# Patient Record
Sex: Female | Born: 1935 | Race: White | Hispanic: No | Smoking: Former smoker
Health system: Southern US, Community
[De-identification: ages and names within clinical notes are randomized; demographics above are authoritative.]

## PROBLEM LIST (undated history)

## (undated) DIAGNOSIS — I6350 Cerebral infarction due to unspecified occlusion or stenosis of unspecified cerebral artery: Secondary | ICD-10-CM

## (undated) HISTORY — PX: APPENDECTOMY: SHX54

## (undated) HISTORY — PX: ABDOMINAL HYSTERECTOMY: SHX81

## (undated) HISTORY — DX: Cerebral infarction due to unspecified occlusion or stenosis of unspecified cerebral artery: I63.50

## (undated) HISTORY — PX: CHOLECYSTECTOMY: SHX55

---

## 1998-05-29 DIAGNOSIS — Z90710 Acquired absence of both cervix and uterus: Secondary | ICD-10-CM | POA: Insufficient documentation

## 2000-02-29 ENCOUNTER — Ambulatory Visit (HOSPITAL_COMMUNITY): Admission: RE | Admit: 2000-02-29 | Discharge: 2000-02-29 | Payer: Self-pay | Admitting: Neurosurgery

## 2000-02-29 ENCOUNTER — Encounter: Payer: Self-pay | Admitting: Neurosurgery

## 2000-03-14 ENCOUNTER — Encounter: Payer: Self-pay | Admitting: Neurosurgery

## 2000-03-14 ENCOUNTER — Ambulatory Visit (HOSPITAL_COMMUNITY): Admission: RE | Admit: 2000-03-14 | Discharge: 2000-03-14 | Payer: Self-pay | Admitting: Neurosurgery

## 2000-04-24 ENCOUNTER — Ambulatory Visit (HOSPITAL_COMMUNITY): Admission: RE | Admit: 2000-04-24 | Discharge: 2000-04-24 | Payer: Self-pay | Admitting: Neurosurgery

## 2000-05-03 ENCOUNTER — Ambulatory Visit: Admission: RE | Admit: 2000-05-03 | Discharge: 2000-05-03 | Payer: Self-pay | Admitting: Neurosurgery

## 2000-05-03 ENCOUNTER — Encounter: Payer: Self-pay | Admitting: Neurosurgery

## 2001-02-17 ENCOUNTER — Ambulatory Visit (HOSPITAL_COMMUNITY): Admission: RE | Admit: 2001-02-17 | Discharge: 2001-02-17 | Payer: Self-pay | Admitting: Neurology

## 2001-02-17 ENCOUNTER — Encounter: Payer: Self-pay | Admitting: Neurology

## 2004-04-29 ENCOUNTER — Ambulatory Visit: Payer: Self-pay | Admitting: Specialist

## 2004-05-10 ENCOUNTER — Inpatient Hospital Stay: Payer: Self-pay | Admitting: Specialist

## 2004-06-24 ENCOUNTER — Ambulatory Visit: Payer: Self-pay

## 2004-07-01 ENCOUNTER — Ambulatory Visit: Payer: Self-pay

## 2004-08-12 ENCOUNTER — Ambulatory Visit: Payer: Self-pay | Admitting: Specialist

## 2004-08-15 ENCOUNTER — Observation Stay: Payer: Self-pay | Admitting: Specialist

## 2004-08-24 ENCOUNTER — Inpatient Hospital Stay: Payer: Self-pay | Admitting: Unknown Physician Specialty

## 2004-08-24 ENCOUNTER — Observation Stay: Payer: Self-pay | Admitting: Specialist

## 2005-05-29 DIAGNOSIS — I2510 Atherosclerotic heart disease of native coronary artery without angina pectoris: Secondary | ICD-10-CM | POA: Insufficient documentation

## 2005-05-29 HISTORY — PX: CORONARY ARTERY BYPASS GRAFT: SHX141

## 2005-08-14 ENCOUNTER — Emergency Department: Payer: Self-pay | Admitting: Emergency Medicine

## 2005-08-14 ENCOUNTER — Other Ambulatory Visit: Payer: Self-pay

## 2005-08-15 ENCOUNTER — Other Ambulatory Visit: Payer: Self-pay

## 2005-08-25 ENCOUNTER — Ambulatory Visit: Payer: Self-pay | Admitting: Cardiology

## 2005-09-04 ENCOUNTER — Ambulatory Visit: Payer: Self-pay

## 2005-09-05 ENCOUNTER — Ambulatory Visit: Payer: Self-pay | Admitting: *Deleted

## 2005-09-06 ENCOUNTER — Inpatient Hospital Stay (HOSPITAL_COMMUNITY): Admission: EM | Admit: 2005-09-06 | Discharge: 2005-09-08 | Payer: Self-pay | Admitting: Emergency Medicine

## 2005-09-07 ENCOUNTER — Encounter: Payer: Self-pay | Admitting: Vascular Surgery

## 2005-09-19 ENCOUNTER — Ambulatory Visit: Payer: Self-pay | Admitting: Cardiovascular Disease

## 2005-09-23 ENCOUNTER — Inpatient Hospital Stay: Payer: Self-pay | Admitting: Unknown Physician Specialty

## 2005-09-23 ENCOUNTER — Other Ambulatory Visit: Payer: Self-pay

## 2005-10-17 ENCOUNTER — Ambulatory Visit: Payer: Self-pay | Admitting: *Deleted

## 2005-11-27 ENCOUNTER — Encounter (INDEPENDENT_AMBULATORY_CARE_PROVIDER_SITE_OTHER): Payer: Self-pay | Admitting: Specialist

## 2005-11-27 ENCOUNTER — Inpatient Hospital Stay (HOSPITAL_COMMUNITY): Admission: RE | Admit: 2005-11-27 | Discharge: 2005-12-02 | Payer: Self-pay | Admitting: Surgery

## 2005-12-13 ENCOUNTER — Ambulatory Visit: Payer: Self-pay | Admitting: Cardiovascular Disease

## 2006-01-01 ENCOUNTER — Ambulatory Visit: Payer: Self-pay

## 2006-02-06 ENCOUNTER — Ambulatory Visit: Payer: Self-pay | Admitting: Cardiology

## 2006-08-23 ENCOUNTER — Ambulatory Visit: Payer: Self-pay | Admitting: Family Medicine

## 2006-10-03 ENCOUNTER — Ambulatory Visit: Payer: Self-pay | Admitting: Gastroenterology

## 2006-10-03 ENCOUNTER — Encounter: Payer: Self-pay | Admitting: Family Medicine

## 2006-10-09 ENCOUNTER — Encounter: Payer: Self-pay | Admitting: Family Medicine

## 2006-10-19 ENCOUNTER — Encounter: Payer: Self-pay | Admitting: Family Medicine

## 2006-10-24 ENCOUNTER — Ambulatory Visit: Payer: Self-pay | Admitting: Family Medicine

## 2006-10-24 LAB — CONVERTED CEMR LAB
Bilirubin Urine: NEGATIVE
Ketones, urine, test strip: NEGATIVE
Nitrite: NEGATIVE
Specific Gravity, Urine: 1.025
Urobilinogen, UA: 0.2

## 2006-10-25 ENCOUNTER — Encounter: Payer: Self-pay | Admitting: Family Medicine

## 2006-11-04 ENCOUNTER — Emergency Department: Payer: Self-pay | Admitting: Emergency Medicine

## 2006-11-07 ENCOUNTER — Ambulatory Visit: Payer: Self-pay | Admitting: Family Medicine

## 2006-11-07 ENCOUNTER — Telehealth (INDEPENDENT_AMBULATORY_CARE_PROVIDER_SITE_OTHER): Payer: Self-pay | Admitting: *Deleted

## 2006-11-07 LAB — CONVERTED CEMR LAB
Glucose, Urine, Semiquant: NEGATIVE
Ketones, urine, test strip: NEGATIVE
Nitrite: NEGATIVE
pH: 6

## 2006-11-29 ENCOUNTER — Telehealth: Payer: Self-pay | Admitting: Family Medicine

## 2006-12-08 ENCOUNTER — Emergency Department: Payer: Self-pay | Admitting: Unknown Physician Specialty

## 2006-12-08 ENCOUNTER — Other Ambulatory Visit: Payer: Self-pay

## 2006-12-14 ENCOUNTER — Ambulatory Visit: Payer: Self-pay | Admitting: Cardiology

## 2007-08-12 ENCOUNTER — Telehealth: Payer: Self-pay | Admitting: Family Medicine

## 2007-08-12 DIAGNOSIS — N63 Unspecified lump in unspecified breast: Secondary | ICD-10-CM

## 2007-08-15 ENCOUNTER — Encounter: Payer: Self-pay | Admitting: Family Medicine

## 2007-08-20 ENCOUNTER — Encounter (INDEPENDENT_AMBULATORY_CARE_PROVIDER_SITE_OTHER): Payer: Self-pay | Admitting: *Deleted

## 2007-08-26 ENCOUNTER — Encounter: Payer: Self-pay | Admitting: Family Medicine

## 2007-08-26 DIAGNOSIS — IMO0002 Reserved for concepts with insufficient information to code with codable children: Secondary | ICD-10-CM

## 2007-08-26 DIAGNOSIS — I6350 Cerebral infarction due to unspecified occlusion or stenosis of unspecified cerebral artery: Secondary | ICD-10-CM | POA: Insufficient documentation

## 2007-08-26 DIAGNOSIS — R569 Unspecified convulsions: Secondary | ICD-10-CM

## 2007-08-26 DIAGNOSIS — F329 Major depressive disorder, single episode, unspecified: Secondary | ICD-10-CM

## 2007-08-26 DIAGNOSIS — R1310 Dysphagia, unspecified: Secondary | ICD-10-CM

## 2007-08-26 DIAGNOSIS — K7689 Other specified diseases of liver: Secondary | ICD-10-CM

## 2007-08-26 DIAGNOSIS — I2510 Atherosclerotic heart disease of native coronary artery without angina pectoris: Secondary | ICD-10-CM | POA: Insufficient documentation

## 2007-08-26 DIAGNOSIS — K5730 Diverticulosis of large intestine without perforation or abscess without bleeding: Secondary | ICD-10-CM | POA: Insufficient documentation

## 2007-08-26 DIAGNOSIS — Z860100 Personal history of colon polyps, unspecified: Secondary | ICD-10-CM | POA: Insufficient documentation

## 2007-08-26 DIAGNOSIS — R259 Unspecified abnormal involuntary movements: Secondary | ICD-10-CM | POA: Insufficient documentation

## 2007-08-26 DIAGNOSIS — R5382 Chronic fatigue, unspecified: Secondary | ICD-10-CM

## 2007-08-26 DIAGNOSIS — M545 Low back pain: Secondary | ICD-10-CM

## 2007-08-26 DIAGNOSIS — IMO0001 Reserved for inherently not codable concepts without codable children: Secondary | ICD-10-CM

## 2007-08-26 DIAGNOSIS — K449 Diaphragmatic hernia without obstruction or gangrene: Secondary | ICD-10-CM | POA: Insufficient documentation

## 2007-08-26 DIAGNOSIS — R519 Headache, unspecified: Secondary | ICD-10-CM | POA: Insufficient documentation

## 2007-08-26 DIAGNOSIS — K222 Esophageal obstruction: Secondary | ICD-10-CM

## 2007-08-26 DIAGNOSIS — E785 Hyperlipidemia, unspecified: Secondary | ICD-10-CM

## 2007-08-26 DIAGNOSIS — I6529 Occlusion and stenosis of unspecified carotid artery: Secondary | ICD-10-CM

## 2007-08-26 DIAGNOSIS — R51 Headache: Secondary | ICD-10-CM | POA: Insufficient documentation

## 2007-08-26 DIAGNOSIS — Z8679 Personal history of other diseases of the circulatory system: Secondary | ICD-10-CM | POA: Insufficient documentation

## 2007-08-26 DIAGNOSIS — K589 Irritable bowel syndrome without diarrhea: Secondary | ICD-10-CM

## 2007-08-26 DIAGNOSIS — T7492XA Unspecified child maltreatment, confirmed, initial encounter: Secondary | ICD-10-CM | POA: Insufficient documentation

## 2007-08-26 HISTORY — DX: Cerebral infarction due to unspecified occlusion or stenosis of unspecified cerebral artery: I63.50

## 2007-08-27 ENCOUNTER — Ambulatory Visit: Payer: Self-pay | Admitting: Family Medicine

## 2007-08-27 DIAGNOSIS — D239 Other benign neoplasm of skin, unspecified: Secondary | ICD-10-CM | POA: Insufficient documentation

## 2007-08-27 DIAGNOSIS — R109 Unspecified abdominal pain: Secondary | ICD-10-CM

## 2007-08-27 DIAGNOSIS — M159 Polyosteoarthritis, unspecified: Secondary | ICD-10-CM

## 2007-08-27 LAB — CONVERTED CEMR LAB
Bilirubin Urine: NEGATIVE
Ketones, urine, test strip: NEGATIVE
Specific Gravity, Urine: >=1.03

## 2007-09-03 ENCOUNTER — Telehealth: Payer: Self-pay | Admitting: Family Medicine

## 2007-10-10 ENCOUNTER — Ambulatory Visit: Payer: Self-pay | Admitting: Family Medicine

## 2007-10-10 DIAGNOSIS — R3129 Other microscopic hematuria: Secondary | ICD-10-CM

## 2007-10-10 DIAGNOSIS — R197 Diarrhea, unspecified: Secondary | ICD-10-CM

## 2007-10-10 LAB — CONVERTED CEMR LAB
Nitrite: NEGATIVE
Specific Gravity, Urine: >=1.03
WBC Urine, dipstick: NEGATIVE

## 2007-10-14 ENCOUNTER — Encounter: Payer: Self-pay | Admitting: Family Medicine

## 2007-10-18 ENCOUNTER — Encounter (INDEPENDENT_AMBULATORY_CARE_PROVIDER_SITE_OTHER): Payer: Self-pay | Admitting: *Deleted

## 2007-10-20 DIAGNOSIS — M899 Disorder of bone, unspecified: Secondary | ICD-10-CM | POA: Insufficient documentation

## 2007-10-20 DIAGNOSIS — M949 Disorder of cartilage, unspecified: Secondary | ICD-10-CM

## 2007-10-30 ENCOUNTER — Ambulatory Visit: Payer: Self-pay | Admitting: Family Medicine

## 2007-11-08 ENCOUNTER — Ambulatory Visit: Payer: Self-pay | Admitting: Family Medicine

## 2007-11-08 DIAGNOSIS — R5383 Other fatigue: Secondary | ICD-10-CM

## 2007-11-08 DIAGNOSIS — R5381 Other malaise: Secondary | ICD-10-CM

## 2007-11-08 DIAGNOSIS — G4700 Insomnia, unspecified: Secondary | ICD-10-CM | POA: Insufficient documentation

## 2007-11-20 LAB — CONVERTED CEMR LAB
ALT: 18 units/L (ref 0–35)
AST: 29 units/L (ref 0–37)
BUN: 14 mg/dL (ref 6–23)
CO2: 25 meq/L (ref 19–32)
Chloride: 108 meq/L (ref 96–112)
Cholesterol: 232 mg/dL (ref 0–200)
Creatinine, Ser: 0.8 mg/dL (ref 0.4–1.2)
Direct LDL: 175.5 mg/dL
Glucose, Bld: 101 mg/dL — ABNORMAL HIGH (ref 70–99)
Total Bilirubin: 0.7 mg/dL (ref 0.3–1.2)
Total CHOL/HDL Ratio: 5.5

## 2008-02-12 ENCOUNTER — Telehealth: Payer: Self-pay | Admitting: Family Medicine

## 2008-02-20 ENCOUNTER — Encounter (INDEPENDENT_AMBULATORY_CARE_PROVIDER_SITE_OTHER): Payer: Self-pay | Admitting: *Deleted

## 2008-03-06 ENCOUNTER — Ambulatory Visit: Payer: Self-pay | Admitting: Family Medicine

## 2008-03-06 DIAGNOSIS — L299 Pruritus, unspecified: Secondary | ICD-10-CM | POA: Insufficient documentation

## 2008-03-09 ENCOUNTER — Telehealth: Payer: Self-pay | Admitting: Family Medicine

## 2008-03-30 ENCOUNTER — Telehealth: Payer: Self-pay | Admitting: Family Medicine

## 2008-07-06 ENCOUNTER — Ambulatory Visit: Payer: Self-pay | Admitting: Gastroenterology

## 2008-09-30 ENCOUNTER — Ambulatory Visit: Payer: Self-pay | Admitting: Family Medicine

## 2008-11-02 ENCOUNTER — Ambulatory Visit: Payer: Self-pay | Admitting: Family Medicine

## 2008-12-02 DIAGNOSIS — F5105 Insomnia due to other mental disorder: Secondary | ICD-10-CM | POA: Insufficient documentation

## 2009-03-10 ENCOUNTER — Encounter (INDEPENDENT_AMBULATORY_CARE_PROVIDER_SITE_OTHER): Payer: Self-pay | Admitting: *Deleted

## 2010-06-19 ENCOUNTER — Encounter: Payer: Self-pay | Admitting: Neurology

## 2010-06-23 ENCOUNTER — Ambulatory Visit: Payer: Self-pay | Admitting: Ophthalmology

## 2010-06-28 ENCOUNTER — Ambulatory Visit: Payer: Self-pay | Admitting: Ophthalmology

## 2010-08-08 ENCOUNTER — Ambulatory Visit: Payer: Self-pay | Admitting: Ophthalmology

## 2010-10-11 NOTE — Assessment & Plan Note (Signed)
[facility]                            CARDIOLOGY OFFICE NOTE   NAME:[name]                      MRN:          [id number]  DATE:12/14/2006                            DOB:          21-Sep-1933    Ms. [name] comes in today after experiencing what sounds like presyncope  at home about a week ago.  She was evaluated at [facility].  Her records have been sent.  Her EKG showed no acute  changes.  Her cardiac enzymes were non-diagnostic.  Chest x-ray showed  no acute cardiopulmonary disease.  Head CT demonstrated some  microvascular disease but no acute abnormality.  D-dimer was negative.  CBC, comprehensive metabolic panel were all normal.  She has some chest  wall pain but has not had any real ischemic pain.  I am not sure from  talking to her that she really fainted.   CURRENT MEDICATIONS:  1. Aspirin 81 mg  a day.  2. Garlic.  3. Multivitamin.  4. Tranxene 7.5 q.h.s.  5. She is apparently holding her Lipitor for unknown reasons.   As usual she really is very tangential in her answers and very difficult  to pin down.  At one point, she said that they did everything at  [facility] including drew blood out of her breast.   PHYSICAL EXAMINATION:  VITAL SIGNS:  Her blood pressure today is 128/85.  Her pulse is 96 and regular.  Weight is 161.  GENERAL:  She is in no acute distress.  HEENT:  Normocephalic atraumatic.  PERRLA.  Extraocular movements  intact.  Sclerae are clear.  Facial symmetry is normal.  NECK:  Carotids  upstrokes were equal bilaterally without bruits.  There is no JVD.  Thyroid is not enlarged.  Trachea is midline.  LUNGS:  Clear.  HEART:  Reveals a regular rate and rhythm without gallop.  ABDOMEN:  Soft.  Good bowel sounds.  No midline bruit.  There is no  hepatomegaly.  EXTREMITIES:  No cyanosis, clubbing, or edema.  Pulses are intact.  NEUROLOGIC:  Grossly intact.  CHEST:  Pressure on her  chest does reproduce her symptoms in her chest  pain.  I suspect she has some mild costochondritis.  I repeated her EKG  today which showed no acute changes.   ASSESSMENT/PLAN:  1. Presyncope, doubt cardiovascular etiology.  I think there is some      significant psychosomatic component to all of her illness and      presentations.  This has been noted in the past as well.  Please      refer to previous notes.  2. Coronary artery disease, currently stable.  3. Hyperlipidemia, noncompliant with Lipitor.  4. Chest wall pain, probably costochondritis.   I have reassured her and her son that there is nothing acute going on  with her cardiovascular system.  I have set her up for followup in 12  months.     Thomas C. Daleen Squibb, MD, [facility]  Electronically Signed    TCW/MedQ  DD: 12/14/2006  DT: 12/14/2006  Job #: [id number]  cc:   Kerby Nora, MD

## 2010-10-14 NOTE — Assessment & Plan Note (Signed)
[facility]                              CARDIOLOGY OFFICE NOTE   NAME:Stallworth, RAEANN OFFNER                      MRN:          [id number]  DATE:12/13/2005                            DOB:          01-11-1934    SUBJECTIVE:  Ms. Schnieders is a very pleasant 77 year old female patient  followed by Dr. Daleen Squibb with a history of three vessel coronary disease who was  recently admitted to [facility] for coronary artery bypass  grafting.  She had initially been seen back in April when she presented with  substernal chest tightness and underwent catheterization that showed 60%  left main stenosis, 50-60% stenosis of the LAD, and 50-60% stenosis of the  RCA.  She underwent CABG x4 on July 2 by Dr. Laneta Simmers.  She had a LIMA placed  to the LAD, vein graft to the diagonal, vein graft to the obtuse marginal,  vein graft to the posterior descending.  She had also recently been noted to  have right upper and lower lung nodules and these were resected at the time  of her bypass.  The pathology was benign.  Her postoperative course was  fairly uneventful.  She returns today for follow-up.  She is doing well  without any significant shortness of breath, orthopnea, or paroxysmal  nocturnal dyspnea, fevers, chills, syncope, or presyncope.  She does have a  lot of chest soreness as well as right upper chest soreness.  Her previous  anginal symptoms have completely resolved.   CURRENT MEDICATIONS:  1.  Lipitor 10 mg daily.  2.  Tranxene 10.5 mg q.h.s.  3.  Tegretol 200 mg b.i.d.  4.  Aspirin 325 mg daily.  5.  Lopressor 25 mg b.i.d.  6.  Geodon 40 mg in the morning, 80 mg in the evening.  7.  Nitroglycerin p.r.n.  8.  Tylox p.r.n.   PHYSICAL EXAMINATION:  GENERAL:  She is a well-nourished, well-developed  female in no acute distress.  VITAL SIGNS:  Blood pressure is 102/58, pulse 88, weight 145 pounds.  HEENT:  Unremarkable.  NECK:  Without JVD.  CARDIAC:  S1, S2.   Regular rate and rhythm without murmurs.  LUNGS:  Clear to auscultation bilaterally without wheezing, rhonchi, or  rales.  ABDOMEN:  Soft, nontender with normoactive bowel sounds.  No organomegaly.  EXTREMITIES:  Without edema.  SKIN:  Chest and leg wounds are healing well without erythema or discharge.   Electrocardiogram reveals sinus rhythm with a heart rate of 88.  T-wave  inversions in V2-6.   IMPRESSION:  1.  Coronary artery disease.      1.  Status post coronary artery bypass graft x4 with grafts as noted          above.  2.  Preserved left ventricular function.  3.  Benign right upper and lower lung nodules.      1.  Status post wedge resection at time of her bypass.  4.  Bipolar disorder.  5.  Fibromyalgia.  6.  History of cerebrovascular accident in the past.  7.  Treated dyslipidemia.  8.  History of seizure disorder.  9.  Chronic fatigue syndrome.   PLAN:  Patient is doing well from a post bypass standpoint.  Her chest x-ray  today shows a tiny right effusion, otherwise nothing acute is found.  The  final report is pending on her chest x-ray.  This will be provided to her to  take to Dr. Laneta Simmers for her appointment next week.  She is on a good medical  regimen at this point in time with aspirin, statin, and beta blocker.  Will  continue all of her medications same at this point.  She will need follow-up  lipids and LFTs and this will be set up some time in the next couple of  weeks.  I will set her up to see Dr. Daleen Squibb in the next eight weeks.  I have  encouraged the patient to enroll in cardiac rehabilitation at [facility].                                  Tereso Newcomer, PA-C    SW/MedQ  DD:  12/13/2005  DT:  12/13/2005  Job #:  [id number]   cc:   Evelene Croon, MD  [facility]

## 2010-10-14 NOTE — Discharge Summary (Signed)
[name], [name]               ACCOUNT NO.:  [id number]   MEDICAL RECORD NO.:  [id number]          PATIENT TYPE:  INP   LOCATION:  6527                         FACILITY:  [facility]   PHYSICIAN:  Thomas C. Wall, M.D.   DATE OF BIRTH:  03/15/1934   DATE OF ADMISSION:  09/05/2005  DATE OF DISCHARGE:  09/08/2005                                 DISCHARGE SUMMARY   DISCHARGE SUMMARY   PRIMARY CARDIOLOGIST:  Dr. Valera Castle.   NEUROLOGIST:  Dr. Marcelino Freestone.   SURGERY CONSULT:  Dr. Sheliah Plane.   PRINCIPAL DIAGNOSIS:  Chest pain/coronary artery disease.   OTHER DIAGNOSES:  1.  History of fibromyalgia and chronic fatigue.  2.  Questionable history of seizure disorder.  3.  Tobacco abuse.   ALLERGIES:  MRI GADOLINIUM   PROCEDURES:  Left heart cardiac catheterization.   HISTORY OF PRESENT ILLNESS:  This is a 77 year old white female with a prior  history of fibromyalgia and chronic fatigue syndrome, who was admitted on  September 05, 2005 following a 3 to 4 week history of atypical chest pain with  radiation to the right shoulder with subsequent adenosine Myoview showing an  EF of 77% with anterior ischemia or decreased activity in the anterior wall  with reversibility.  She also complained of nausea, vomiting and diarrhea in  the absence of fever with diffuse abdominal cramping and pain.   HOSPITAL COURSE:  The patient ruled out for myocardial infarction by cardiac  markers.  ECG was without acute changes.  She underwent left heart cardiac  catheterization on April 12 which revealed a 6% lesion in the left main with  dampening of pressure wave forms.  The left circumflex was completely  occluded and the right coronary artery had a 60 to 70% mid-stenosis.  EF was  normal.  Films were reviewed with Dr. Daleen Squibb and Dr. Riley Kill, and the decision  was made to pursue a cardiovascular thoracic surgery consult.  The patient  felt that she needed some time to make a decision regarding  bypass surgery  and Dr. Tyrone Sage also recommended outpatient psychiatry and neurology  evaluations prior to pursuing surgery.  Further, she had a chest x-ray  during her hospitalization which revealed bilateral nodular densities and it  was recommended that she undergo a CT scan.  This will be performed as an  outpatient at [facility].  She is being discharged home today  in satisfactory condition with the understanding that she will undergo  psychiatry and neurology evaluation as well as a CAT scan performed prior to  seeing Dr. Daleen Squibb back in the clinic on April 14 at 11:15 a.m.   DISCHARGE LABS:  Hemoglobin 11.6, hematocrit 33.6, WBC 12.1, platelets 195.  MCV 101.6.  Sodium 144, potassium 3.3 (replaced prior to discharge),  chloride 109, CO225, BUN 10, creatinine 0.7, glucose 133.  PT 14.1, INR 1.1,  PTT 30.  Total bilirubin 0.3, alkaline phosphatase 65, AST 27, ALT 27,  lipase 21, albumin 3.5.  Cardiac markers negative times three.  Total  cholesterol 129, triglycerides 67, HDL 36, LDL 80.  Calcium  8.9, magnesium  2.0.  BMP 41.0.  TSH 0.554.   DISPOSITION:  The patient is being discharged home today in good condition.   FOLLOWUP PLANS AND APPOINTMENT:  The patient has a followup appointment with  Dr. Daleen Squibb on September 09, 2005 at 11:15 a.m.  We will also make arrangements for  her to follow up with Dr. Orlin Hilding who she has seen in Neurology before.  She  also has a Psychiatry followup in [place].   DISCHARGE MEDICATIONS:  1.  Aspirin 81 mg daily.  2.  Lipitor 10 mg daily  3.  Nitroglycerin 0.4 mg sublingual as needed for chest pain.  4.  Tranxene 7.5 mg at bedtime.   LAB STUDIES:  None.   DURATION OF DISCHARGE ENCOUNTER:  35 minutes including physician time.      Ok Anis, NP      Jesse Sans. Wall, M.D.  Electronically Signed    CRB/MEDQ  D:  09/08/2005  T:  09/08/2005  Job:  [id number]   cc:   Santina Evans A. Orlin Hilding, M.D.  Fax: [phone]

## 2010-10-14 NOTE — Consult Note (Signed)
[name], [name]               ACCOUNT NO.:  [id number]   MEDICAL RECORD NO.:  [id number]          PATIENT TYPE:  INP   LOCATION:  6527                         FACILITY:  [facility]   PHYSICIAN:  Sheliah Plane, MD    DATE OF BIRTH:  27-Jul-1933   DATE OF CONSULTATION:  09/07/2005  DATE OF DISCHARGE:                                   CONSULTATION   REQUESTING PHYSICIAN:  Arturo Morton. Riley Kill, M.D. [facility].   FOLLOW UP CARDIOLOGIST:  Maisie Fus C. Wall, M.D.   PRIMARY CARE PHYSICIAN:  Dr. Sherrie Mustache.  The patient has had extensive care  done at [facility], but at this point all these records are not  available.   REASON FOR CONSULTATION:  Coronary occlusive disease.   HISTORY OF PRESENT ILLNESS:  The patient is a 77 year old female, who is an  extremely poor historian, with a rambling sense of time and events, who is  not totally clear why she even came to the hospital other than she has had  substernal chest tightness which radiates into the right arm.  In addition,  her son notes that last week she had some weakness in the right arm and  right facial droop.  Because of her vague symptoms, she was seen in the  __________ office as an outpatient and underwent adenosine study which  showed a 77% ejection fraction and anterior wall reversibility.  Over the  past 2-3 weeks she has had nausea, vomiting, diarrhea, fevers and crampy  abdominal pain.  She notes that she has lost 12-14 pounds, though her son  does not confirm this.  She says that she was admitted 2 nights ago after  the __________ office called her, told her that her stress test was abnormal  and she should come straight to the emergency room.  However, even this  history is somewhat confusing as she is not consistent about information  that she gives.  She denies any prior myocardial infarction, prior cardiac  surgery or prior angioplasty.   CARDIAC REVIEW OF SYSTEMS:  The patient denies hypertension, denies  diabetes, denies  hyperlipidemia.  She is a smoker and has been smoking up  until admission, and has for many years.  Family history is significant for  a stroke in her father at age 37, myocardial infarction in her mother at age  71.  She is one of 13 siblings.  Of 8 brothers, 3 of them died, one at age  83 of myocardial infarction.  Of 5 girls, 2 died in infancy.  One was a  blue baby.  The patient notes that she has had a previous stroke, but could  not remember any of the details.  She denies renal insufficiency, but notes  that she had some kidney problem that her doctor, several weeks ago, told  her to drink 2 beers a night.   PAST MEDICAL HISTORY:  1.  History of seizures.  The last one was two weeks ago.  She notes that      she sees Dr. Orlin Hilding, but could not remember when she had last seen her.  2.  She has questioned history of right eye amaurosis five years ago.  3.  Over 10 year history of chronic myalgia and chronic fatigue syndrome.      She notes that it was gone for a while and returned three months ago.   PREVIOUS SURGICAL HISTORY:  1.  Hysterectomy.  2.  Carpal tunnel on the left.  3.  Knee surgery.  4.  Neck surgery 10 years ago.  5.  Cholecystectomy 8 years ago.   The patient lives alone.  One of her sons has medical care power of  attorney.   MEDICATIONS:  The patient is unaware of any medications that she takes and  was not able to give [place] a list.  In addition, her med reconciliation sheet  is not filled out.  DRUG ALLERGIES INCLUDE SULFA AND CONTRAST DYE.   Cardiac review of systems is positive for every question asked, including  chest pain, lower extremity edema, palpitations, syncope and she notes she  has syncopal episodes at least once a month, notes exertional shortness of  breath, she has presyncopal episodes every day, orthopnea is noted for her  sleeping in a reclining chair with the window open.   GENERAL REVIEW OF SYSTEMS:  The patient notes a 12-14 pound weight  loss over  the past two weeks with frequent diarrhea and vomiting.  No blood in her  stool.  She notes she was previously diagnosed with liver cancer many years  ago, but makes great glee in the fact that the physicians were wrong about  the diagnosis.  She notes, and her son notes, that she has had a tremor of  her head for the past several years.  Although the patient denied it, in  discussing her with the son, he notes that for over 30 years she has had a  very strong positive psychiatric history with diagnosis of bipolar disease  which had improved over some years, but recently has been getting worse, and  currently she is on no medications.   EXAMINATION:  The patient's blood pressure is 130/71, her heart rate is 70  in sinus.  She has an obvious tremor of her head.  Her pupils equal, round  and reactive to light.  Neck is without carotid bruits.  Lungs are clear.  Cardiac exam reveals regular rate and rhythm without murmur or gallop.  Abdominal exam is benign.  She has no palpable masses or tenderness.  Lower  extremities have 1+ DP and PT pulses.  She appears to have adequate vein in  lower extremities for bypass.   The patient's EKG is normal.  CK/MBs are normal.  Troponin is 0.1.  Glucose  is slightly elevated at 136.  Creatinine is 0.8.  Lipase is 21.  Liver  enzymes are normal.  White count is 7.9, hematocrit is 36.  Chest x-ray  shows nodular densities over both lateral first ribs.  Radiology recommends  a CT scan.   Cardiac catheterization films are reviewed and discussed with Dr. Riley Kill.  The patient has dampening of the left main catheter with approximately 50-  60% left main stenosis.  She has 50-60% stenosis of the LAD involving the  diagonal, mid right is approximately 50-60.  In general, the patient has  normal LV function.   IMPRESSION:  Patient with severe psychiatric illness, with probable currently not treated bipolar disease making it very difficult to get   reliable history from her.  She does have a positive stress test  with  evidence of left main disease, but none highly critical.  With the positive  stress test and her coronary disease, her would benefit from coronary artery  bypass grafting.  I have discussed this with her and her son.  At this point  she is not interested in making a decision about bypass surgery, which at  this point I would suggest:   1.  Psychiatric consult.  2.  CT scan of the chest, question of lung lesions.  3.  Evaluation or at least waiting to make sure that the GI symptoms are      temporary and not some underlying disease process.  If her psychiatric      symptoms and cooperation can be stabilized, we could proceed with      coronary artery bypass grafting, as I have recommended to the patient.      Initially I recommended that we would proceed on September 12, 2005,      Tuesday, however the patient has declined this and says it would take      her several weeks at home to make up her mind.      Sheliah Plane, MD  Electronically Signed     EG/MEDQ  D:  09/07/2005  T:  09/07/2005  Job:  [id number]

## 2010-10-14 NOTE — Assessment & Plan Note (Signed)
[facility]                                 ON-CALL NOTE   NAME:Carneiro, RICARDO KAYES                      MRN:          [id number]  DATE:11/19/2006                            DOB:          [birth year]    PRIMARY CARDIOLOGIST:  Jesse Sans. Wall, MD, [facility]   DESCRIPTION OF CALL:  Ms. Knotek is a 77 year old woman with known  coronary artery disease status post previous bypass surgery.  [place]  [place] had [place] swelling and they told her it was her  heart.  She now returns and says she does not feel well.  She says  anything she eats runs right through her.  She says her swelling is  better, but she just does not feel well at all.  She said she wanted to  come to the emergency room but did not want to go to [facility].  Unfortunately, there was no one at home who could give her a ride.  She  denied any significant chest pain.   I asked her if it was possible for her to follow up with her primary  care physician today, Dr. Ermalene Searing, but she said she was out as she just  recently gave birth.  I told her that if she felt she needed to be  evaluated urgently and she did not have any family members to take her  to the emergency room, that she could call an ambulance to take her to  the hospital.  She was adamant that she did not want to go to [facility], and that she would have someone take her to [facility].  I told her I felt this would appropriate.  She can be  evaluated by the emergency room physicians, and if there was a cardiac  issue, we would be happy to see her in the emergency room.   Our office contacted her emergency contact, her son Salvador Bigbee, who  said he would be able to take her to the hospital.     Bevelyn Buckles. Bensimhon, MD  Electronically Signed    DRB/MedQ  DD: 11/19/2006  DT: 11/19/2006  Job #: [id number]

## 2010-10-14 NOTE — Op Note (Signed)
[name], [name]               ACCOUNT NO.:  [id number]   MEDICAL RECORD NO.:  [id number]          PATIENT TYPE:  INP   LOCATION:  2315                         FACILITY:  [facility]   PHYSICIAN:  Evelene Croon, M.D.     DATE OF BIRTH:  Oct 05, 1933   DATE OF PROCEDURE:  11/27/2005  DATE OF DISCHARGE:                                 OPERATIVE REPORT   PREOPERATIVE AND POSTOPERATIVE DIAGNOSIS:  Severe left main and three-vessel  coronary disease, right upper lobe and left and right lower lobe nodules.   OPERATIVE PROCEDURE:  Median sternotomy, extracorporeal circulation,  coronary bypass graft surgery x4 using a left internal mammary artery graft  to left anterior descending coronary, with a saphenous vein graft to  diagonal branch LAD, saphenous vein graft to the obtuse marginal branch of  the left circumflex coronary, and a saphenous vein graft to the posterior  descending branch of the right coronary.  Endoscopic vein harvesting from  the right leg.  A wedge resection of right upper lobe and right lower lobe  lung nodules.   ATTENDING SURGEON:  Evelene Croon, M.D.   ASSISTANT:  [name], P.A.C.   ANESTHESIA:  General endotracheal.   CLINICAL HISTORY:  This patient is a 77 year old woman with history of  bipolar disorder, chronic fatigue syndrome, and fibromyalgia who was seen by  Dr. Jaynie Collins initially on 09/07/2005 for evaluation coronary occlusive  disease.  She had had abnormal Cardiolite adenosine study showing ejection  fraction of 77% with anterior wall ischemia.  Cardiac catheterization showed  a 60% left main stenosis.  There was dampening of the left main catheter  tracing with engagement.  She also had about 56% stenosis of the LAD  involving diagonal as well as 50 to 60% right coronary stenosis.  Ejection  fraction was normal at catheterization.  At that time she refused cardiac  surgery and she was admitted to [facility] for 3 weeks  and  behavioral health.  She was eventually discharged and came back to Dr.  Tyrone Sage.  The patient was referred to me because I had operated on her  brother in the past.  She was also noted to have nodular densities overlying  both lateral 1st ribs and a CT scan of the chest was performed which showed  few small nodules in the right lung, one being in the right upper lobe and  one in the right lower lobe.  There were no nodules noted in the left lung.  After review of the studies, I felt the best treatment would be to proceed  with coronary bypass surgery and wedge resection of these nodular densities  in the right upper and right lower lobe.  I discussed the operative  procedure with the patient and her son including alternatives, benefits, and  risks including but not limited to bleeding, blood transfusion, infection,  stroke, myocardial infarction, graft failure, and death.  She understood and  agreed to proceed.   OPERATIVE PROCEDURE:  The patient was taken to the operating room, placed on  table in supine position.  After induction of  general endotracheal  anesthesia a Foley catheter placed in bladder using sterile technique.  Then  the chest, abdomen and both lower extremities were prepped and draped in  usual sterile manner.  The chest was entered through the median sternotomy  incision.  The pericardium opened in the midline.  Examination of the heart  showed good ventricular contractility.  The ascending aorta had no palpable  plaques in it.   Then the left internal mammary artery was harvested from the chest wall as a  pedicle graft.  This was a medium caliber vessel with excellent blood flow  through it.  At the same time, a segment of greater saphenous vein was  harvested from the right leg using endoscopic vein harvest technique.  This  vein was a medium size and good quality.   The patient was heparinized.  When an adequate activated clotting time was  achieved, the distal  ascending aorta was cannulated using 20-French aortic  cannula for arterial inflow.  Venous outflow was achieved using a two-stage  venous cannula for the right atrial appendage.  An antegrade cardioplegia  and vent cannula was inserted in the aortic root.   The patient placed on cardiopulmonary bypass and distal coronaries  identified.  The LAD was intramyocardial in its proximal half and exited in  its distal portion where it was fairly small but graftable.  The diagonal  branch was beneath the epicardial fat but was located and was a small but  graftable vessel.  The obtuse marginal was a medium size graftable vessel.  The right coronary was a large dominant system with a large posterior  descending branch.   Then the aorta was crossclamped and 500 mL of cold blood antegrade  cardioplegia was administered in the aortic root with quick arrest the  heart.  Systemic hypothermia to 20 degrees centigrade and topical  hypothermic with iced saline was used.  Temperature probe was placed in the  septum and insulating pad in the pericardium.   The first distal anastomosis was performed to the posterior descending  coronary.  The internal diameter was 1.75 mm.  Conduit used was segment  greater saphenous vein and the anastomosis performed in end-to-side manner  continuous 7-0 Prolene suture.  Flow was noted to graft and was excellent.   The second distal anastomosis was performed to the diagonal branch.  The  internal diameter of this vessel was about 1.5 mm.  Conduit used was a  second segment of greater saphenous vein and anastomosis performed in end-to-  side manner using continuous 7-0 Prolene suture.  Then another dose of  cardioplegia given down vein grafts and aortic root.   A third distal anastomosis was performed to the obtuse marginal branch.  The  internal diameter was 1.75 mm.  Conduit used was a third segment of greater  saphenous vein and anastomosis performed in an end-to-side  manner using a continuous 7-0 Prolene suture.  Flow was noted through the graft was  excellent.   The fourth distal anastomosis was performed to the distal portion of the  LAD.  The internal diameter was about 1.5 mm.  Conduit used was the left  internal mammary graft and this was brought through an opening in the left  pericardium anterior to the phrenic nerve.  It was anastomosed to the LAD in  end-to-side manner using continuous 8-0 Prolene suture.  The pedicle was  sutured to the epicardium with 6-0 Prolene sutures.  Then the patient  rewarmed to 37  degrees centigrade.  With the crossclamp in place, the three  proximal vein graft anastomosis were performed end-to-side manner using  continuous 6-0 Prolene suture.  The clamp was removed from the mammary  artery.  There is rapid warming of ventricular septum and return of  spontaneous ventricular fibrillation.  Crossclamp removed with time of 71  minutes and the patient developed spontaneous sinus rhythm.   The proximal and distal anastomoses appeared hemostatic and line of the  grafts satisfactory.  Graft markers placed around the proximal anastomoses.  Two temporary right ventricular and atrial pacing wires placed and brought  out through the skin.   Then the right pleural space was opened and the right upper lobe palpated.  The small nodular density seen on CT scan was located near the surface in  the right upper lobe.  A wedge resection was performed using linear cutting  stapler and the specimen was sent to pathology.  The second nodule was  located in the posterior aspect of the right lower lobe.  It was easily  palpable.  This lesion was also excised by wedge resection using a linear  cutting stapler.  Both staple lines appeared hemostatic.  The specimens were  both sent to pathology.   Then with the patient had rewarmed to 37 degrees centigrade.  She was weaned  from cardiopulmonary bypass on no inotropic agents.  Total  bypass time was  99 minutes.  Cardiac function appeared excellent.  Cardiac output of 5  liters minute.  Protamine was given and venous and aortic cannulas were  removed without difficulty.  Hemostasis was achieved.  Four chest tubes were  placed with bilateral pleural tubes in the post pericardium one in the  anterior mediastinum.  The pericardium was not closed over the heart since  it was fairly tight.  The sternum was closed with #6 stainless steel wires.  Fascia was closed with continuous #1 Vicryl suture.  Subcutaneous tissue was  closed with continuous 2-0 Vicryl.  The skin with a 3-0 Vicryl subcuticular  closure.  The lower extremity vein harvest sites were closed in layers  similar manner.  The sponge, needle and instrument counts correct according  to scrub nurse.  Dry sterile dressings were applied over the incisions,  around the chest tubes which were Pleur-Evac suction.  The patient remained  hemodynamically stable and was transported to the SICU in guarded but stable  condition.     Evelene Croon, M.D.  Electronically Signed     BB/MEDQ  D:  11/27/2005  T:  11/27/2005  Job:  [id number]   cc:   Cath lab   [facility] cardiology

## 2010-10-14 NOTE — Discharge Summary (Signed)
Ann, Bartlett               ACCOUNT NO.:  [id number]   MEDICAL RECORD NO.:  [id number]          PATIENT TYPE:  INP   LOCATION:  2036                         FACILITY:  [facility]   PHYSICIAN:  Evelene Croon, M.D.     DATE OF BIRTH:  09/13/1933   DATE OF ADMISSION:  11/27/2005  DATE OF DISCHARGE:  12/02/2005                                 DISCHARGE SUMMARY   PRIMARY ADMITTING DIAGNOSIS:  Coronary artery disease   ADDITIONAL/DISCHARGE DIAGNOSES:  1.  Severe left main and 3-vessel coronary artery disease.  2.  Right upper lobe and left and right lower lobe lung nodules.  3.  History of chronic fatigue syndrome.  4.  History of fibromyalgia.  5.  Bipolar disorder.  6.  Status post cerebrovascular accident in the past.  7.  Hyperlipidemia.  8.  History of seizure disorder.   PROCEDURES PERFORMED:  1.  Coronary artery bypass grafting x4 (left internal mammary artery to the      LAD, saphenous venous graft to the diagonal, saphenous venous graft to      the obtuse marginal, saphenous vein graft to the posterior descending.  2.  Endoscopic vein harvest, right leg.  3.  Right upper lobe and right lower lobe wedge resection.   HISTORY:  The patient is a 77 year old female who initially presented to  [facility] back in April 2007 complaining of substernal chest  tightness.  At that time, she underwent a cardiac catheterization which  showed a 60% left main stenosis with a 50-60% stenosis of the LAD involving  the diagonal as well as a 50-60% right coronary artery stenosis.  Ejection  fraction was normal.  She was seen in consultation by Dr. Tyrone Sage and at  the time refused cardiac surgery.  She was also noted at the time to have  nodular densities over both first ribs laterally, and a CT scan of the chest  was recommended.  Since that time, she apparently was hospitalized at  [facility] for 3 weeks for treatment of her bipolar  disorder.  She was  eventually discharged and returned to the office to see  Dr. Tyrone Sage in June 2007.  At that time, she was related continued  intermittent substernal chest discomfort with exertion, but also  occasionally at rest, and relieved by sublingual nitroglycerin.  She felt  that she would like to proceed with surgery.  However, she elected to follow  up with Dr. Evelene Croon because he had operated on her brother in the past.  She subsequently saw Dr. Laneta Simmers in the office and he reviewed her films and  agreed that her best course of action would be to proceed with surgery.  She  also had a CT scan, which confirmed 2 right lung nodules, one measuring 6 mm  in the lateral aspect of the right lung apex and a 7-mm nodule in the  posterior septal aspect of the right lower lobe.  Dr. Laneta Simmers discussed the  risks, benefits and alternatives of surgery with the patient and she agreed  to proceed.  He felt that  at the time of CABG she would also, if possible,  be able to undergo wedge resection of her lung nodules for diagnosis.   HOSPITAL COURSE:  She was admitted to [facility] on November 27, 2005,  and was taken to the operating room by Dr. Laneta Simmers where she underwent CABG  x4 as described in detail above.  Also, she underwent wedge resection of  right upper and lower lobe lung nodules.  She tolerated the procedures well  and was transferred to the SICU in stable condition.  She was able to be  extubated shortly after surgery.  She was hemodynamically stable and doing  well on postop day 1.  At that time, her chest tubes and hemodynamic  monitoring devices were removed, and she was able to be transferred to the  floor.   Overall, postoperatively, she has done very well.  She has been started back  on her psychiatric meds as well as started on a beta blocker and a statin.  She has been counseled regarding smoking cessation and given information on  post-discharge education.  She has been ambulating the  halls with cardiac  rehab phase one and is making good progress.  She has had no significant  volume overload and presently is actually a pound below her preoperative  weight.  She is maintaining normal sinus rhythm, has been afebrile, and all  vital signs have been stable.  Her incisions are all healing well.   Her most recent labs show hemoglobin of 10.6, hematocrit 31, white count  11.5, platelets 106, sodium 141, potassium 4.0, BUN 12, creatinine 0.9.  It  is felt that if she continues to remain stable over the next 24 hours, she  will hopefully be ready for discharge home on December 02, 2005.   DISCHARGE MEDICATIONS ARE AS FOLLOWS:  1.  Enteric-coated aspirin 325 mg daily.  2.  Lopressor 25 mg b.i.d.  3.  Lipitor 10 mg q.h.s.  4.  Clorazepate 7.5 mg q.h.s.  5.  Geodon 40 mg 1 tablet in the morning and 2 in the evening.  6.  Tegretol 200 mg b.i.d.  7.  Tylox 1-2 q.4-6 h. p.r.n. for pain.   DISCHARGE INSTRUCTIONS:  She is asked to refrain from driving, heavy lifting  or strenuous activity.  She may continue ambulating daily and using her  incentive spirometer.  She may shower daily and clean her incisions with  soap and water.   DISCHARGE FOLLOWUP:  She will see Dr. Daleen Squibb back in the office in 2 weeks.  She will have a chest x-ray at that visit.  She will then follow up with Dr.  Laneta Simmers in 3 weeks and our office will contact her with an appointment date  and time.  If she experiences any problems or has questions in the interim,  she is asked to contact our office immediately.      [name], P.A.      Evelene Croon, M.D.  Electronically Signed    GC/MEDQ  D:  12/01/2005  T:  12/01/2005  Job:  [id number]   cc:   Thomas C. Wall, M.D.  [address]  [address]  [place]  [place] [address]   Dr. Kelle Darting   CVTS Office

## 2010-10-14 NOTE — Cardiovascular Report (Signed)
[name], [name]               ACCOUNT NO.:  [id number]   MEDICAL RECORD NO.:  [id number]          PATIENT TYPE:  INP   LOCATION:  6527                         FACILITY:  [facility]   PHYSICIAN:  [name]. [name], M.D. [facility] OF BIRTH:  03-May-1934   DATE OF PROCEDURE:  09/07/2005  DATE OF DISCHARGE:                              CARDIAC CATHETERIZATION   INDICATIONS:  [name] is a 77 year old who has had some recurrent episodes  of chest pain.  The symptoms have been vague, but radionuclide imaging  suggested an anterior defect with redistribution.  Based on this, she was  seen by Dr. Daleen Bartlett and set up for catheterization.   PROCEDURE:  1.  Left heart catheterization.  2.  Selective coronary arteriography.  3.  Selective left ventriculography.   DESCRIPTION OF PROCEDURE:  The patient was brought to the catheterization  laboratory and prepped and draped in usual fashion.  Through an anterior  puncture, the right femoral artery was easily entered and a 6-French sheath  was placed.  We initially had some trouble with damping in the left main,  even despite intracoronary nitroglycerin.  Attention was then turned to the  right coronary artery where coronary arteriography was performed.  We went  back, eventually getting a JL-30 6 [place] guide to seat in the left main.  With all of these catheters, there was a little bit of dampening with any  advancement of the catheter.  There was evidence of a lesion of moderate  severity in the mid portion of the left main.  Following this, central  aortic and left ventricular pressures were measured with a pigtail.  Ventriculography was performed in the RAO projection.  I then brought Dr.  Juanito Bartlett to the catheterization laboratory where he and I carefully reviewed  the films.  After thorough discussion of the patient's history, her  radiographic findings including the catheterization study and radionuclide  imaging, it was felt that a surgical  consult should be obtained to discuss  the possibility of revascularization.   HEMODYNAMIC DATA:  1.  Central aortic pressure 137/71, mean 100.  2.  Left ventricular pressure 136/70.  3.  No gradient pullback across the aortic valve.   ANGIOGRAPHIC DATA:  1.  Ventriculography was done in the RAO projection.  Overall systolic      function was vigorous, and no segmental abnormalities or contraction      were identified.  2.  There was generalized calcification predominantly involving the proximal      circumflex and LAD systems.  There is some distal  RCA calcification as      well.  3.  The right coronary artery is a very large caliber vessel.  The RCA has a      mid calcified lesion that probably approximates 60 to at most 70%.  It      does not appear to be critical.  The PDA and posterolateral arteries are      both very large caliber vessels without critical obstruction.  The mid      RCA lesion does not appear to be  flow-limiting.  4.  The left main, as noted, has some focal narrowing in the proximal to mid      portion of the left main artery.  This measures about 60% luminal      reduction.  It looks smaller than the guiding catheter, suggesting less      than a 2 x 2 lumen.  The proximal LAD is calcified without critical      narrowing other than minor luminal irregularities.  At the LAD diagonal      bifurcation, there is perhaps 40-50% involvement of both the LAD and the      diagonals.  5.  The circumflex ostium may have some mild narrowing of about 40-50%.      This is suggested in the LAO caudal view, although there could be some      foreshortening at this location.  There clearly is some mid disease at      50-60, and 50% the mid portion of the circumflex which is seen on      various views, predominantly the RAO cranial view.   CONCLUSIONS:  1.  Preserved left ventricular function.  2.  Mid left main stenosis of intermediate and yet potentially important       severity.  The circumflex and LAD have scattered lesions.  Surgical      consultation will be obtained.      [name]. [name], M.D. [facility]  Electronically Signed     TDS/MEDQ  D:  09/07/2005  T:  09/07/2005  Job:  [id number]   cc:   [name], M.D.  [address]  [address]  [place]  [place] [address]   [name], M.D.  Fax: [phone]

## 2010-10-14 NOTE — Assessment & Plan Note (Signed)
[facility]                              CARDIOLOGY OFFICE NOTE   NAME:Toomey, LIDIE GLADE                      MRN:          [id number]  DATE:02/06/2006                            DOB:          01/24/1934    Ms. Holloway comes back today for follow-up after having coronary bypass  surgery in July.  She had very atypical pain prior to her evaluation but  ended up having a catheterization which showed a 60% left main, 60% in the  right coronary artery.  She had coronary bypass grafting x4 with Dr. Laneta Simmers  July, LIMA to the LAD, vein graft to a diagonal, vein graft to an obtuse  marginal, vein graft to the posterior descending.   She also had some lung nodules that underwent wedge resection at the time of  her bypass.  These were benign.  She has good LV function.   She feels pretty well today.  She is on Lipitor 10 mg a day and aspirin 325  mg a day.  She seems to be compliant.   Her cholesterol was 153, HDL is 41, LDL was 98 on the Lipitor.   PHYSICAL EXAMINATION:  GENERAL:  She is in no acute distress.  VITAL SIGNS:  Blood pressure 120/74, pulse is 74, her weight is 140, down 5.  NECK:  Her carotids are equal bilaterally without bruits, no JVD.  The  thyroid is not enlarged.  The trachea is midline.  LUNGS:  Clear.  CARDIAC:  A regular rate and rhythm.  ABDOMEN:  Soft.  EXTREMITIES:  No cyanosis, clubbing or edema.   Ms. Darrough is doing well.  I have asked her to continue with her current  program.  We will see her back in July 2008.                               Thomas C. Daleen Squibb, MD, [facility]    TCW/MedQ  DD:  02/06/2006  DT:  02/07/2006  Job #:  [id number]   cc:   Mila Merry

## 2010-10-14 NOTE — H&P (Signed)
NAMEERVA, KOKE               ACCOUNT NO.:  [id number]   MEDICAL RECORD NO.:  [id number]          PATIENT TYPE:  EMS   LOCATION:  MAJO                         FACILITY:  [facility]   PHYSICIAN:  Thomas C. Wall, M.D.   DATE OF BIRTH:  December 06, 1933   DATE OF ADMISSION:  09/05/2005  DATE OF DISCHARGE:                                HISTORY & PHYSICAL   CHIEF COMPLAINT:  Chest discomfort, positive stress test.   HISTORY OF PRESENT ILLNESS:  [age]-year-old female with a history of  fibromyalgia and chronic fatigue syndrome, who was seen by Dr. Daleen Squibb with  atypical chest pain history with no clear triggers, radiating to the right  shoulder, who underwent an adenosine Myoview on September 04, 2005 which showed  an EF of 77% and decreased activity in the anterior wall with reversibility  suggestive of ischemia.  The patient notes that for the last 3 or 4 weeks  she has had nausea and vomiting as well as diarrhea without fevers; this  appears to be worse after food and is associated with diffuse cramping and  abdominal pain.  She reports that she was able to tolerate her last meal  without these symptoms.  She also reports that she is currently chest-pain-  free.   PAST MEDICAL HISTORY:  1.  Fibromyalgia and chronic fatigue.  2.  Questionable history of seizure disorder.   SOCIAL HISTORY:  Lives alone.  Two sons are involved in her life.  She has a  20-pack-year smoking history, but quit several years ago.  No alcohol.  No  illicit drugs.   FAMILY HISTORY:  Mother and father died of old age.  Siblings:  She has 3  brothers with heart disease.   REVIEW OF SYSTEMS:  Diffusely positive.   ALLERGIES:  The patient has an allergy to the CONTRAST THAT WAS USED WITH  MRI.  It is unclear whether these allergies also apply to iodinated  contrast.   MEDICATIONS:  1.  Aspirin 81 mg daily.  2.  Tranxene 7.5 mg h.s.   PHYSICAL EXAMINATION:  VITAL SIGNS:  Temperature is 98.7, pulse 74,  respiratory rate 14 and blood pressure 94/62.  GENERAL:  A chronically ill-appearing woman.  HEENT:  Exam shows pupils equally round and reactive.  Extraocular motion  was intact.  NECK:  Supple.  Lymphadenopathy:  None.  CARDIOVASCULAR:  Regular rate and rhythm with no murmurs, rubs, or gallops.  ABDOMEN:  Soft, but with positive bowel sounds in all 4 quadrants.  EXTREMITIES:  No clubbing, cyanosis, or edema.  MUSCULOSKELETAL:  Exam showed no joint deformity or effusions.  NEUROLOGIC:  Exam shows a resting tremor.   LABORATORY AND ACCESSORY CLINICAL DATA:  EKG shows a heart rate of 72 and  normal sinus rhythm with axis of 90 degrees.  P-R interval is 146 msec.  QRS  is 88.  QTc is 419.   Labs show normal chemistries, normal liver enzymes and negative cardiac  enzymes.   ASSESSMENT AND PLAN:  This is a difficult historian, but a woman with a  positive stress test who should proceed  with cardiac catheterization.  With  regards to her gastrointestinal complaints, they are currently not bothering  her and would defer this to an outpatient evaluation.     ______________________________  Denyse Amass, MD      Jesse Sans Wall, M.D.  Electronically Signed    DBH/MEDQ  D:  09/06/2005  T:  09/06/2005  Job:  [id number]

## 2010-11-21 ENCOUNTER — Ambulatory Visit: Payer: Self-pay | Admitting: Gastroenterology

## 2011-02-28 ENCOUNTER — Other Ambulatory Visit: Payer: Self-pay | Admitting: Gastroenterology

## 2013-03-05 ENCOUNTER — Ambulatory Visit: Payer: Self-pay | Admitting: Family Medicine

## 2013-08-21 ENCOUNTER — Ambulatory Visit: Payer: Self-pay | Admitting: Family Medicine

## 2014-11-11 ENCOUNTER — Encounter: Payer: Self-pay | Admitting: Family Medicine

## 2014-11-11 ENCOUNTER — Other Ambulatory Visit: Payer: Self-pay | Admitting: Family Medicine

## 2014-11-11 ENCOUNTER — Ambulatory Visit (INDEPENDENT_AMBULATORY_CARE_PROVIDER_SITE_OTHER): Payer: Medicare Other | Admitting: Family Medicine

## 2014-11-11 VITALS — BP 184/90 | HR 76 | Temp 98.1°F | Resp 16 | Wt 171.0 lb

## 2014-11-11 DIAGNOSIS — F419 Anxiety disorder, unspecified: Secondary | ICD-10-CM | POA: Diagnosis not present

## 2014-11-11 DIAGNOSIS — I10 Essential (primary) hypertension: Secondary | ICD-10-CM | POA: Diagnosis not present

## 2014-11-11 DIAGNOSIS — I679 Cerebrovascular disease, unspecified: Secondary | ICD-10-CM | POA: Diagnosis not present

## 2014-11-11 DIAGNOSIS — G4700 Insomnia, unspecified: Secondary | ICD-10-CM

## 2014-11-11 DIAGNOSIS — F0390 Unspecified dementia without behavioral disturbance: Secondary | ICD-10-CM | POA: Insufficient documentation

## 2014-11-11 DIAGNOSIS — J3089 Other allergic rhinitis: Secondary | ICD-10-CM | POA: Diagnosis not present

## 2014-11-11 DIAGNOSIS — R1310 Dysphagia, unspecified: Secondary | ICD-10-CM

## 2014-11-11 DIAGNOSIS — R3589 Other polyuria: Secondary | ICD-10-CM

## 2014-11-11 DIAGNOSIS — E785 Hyperlipidemia, unspecified: Secondary | ICD-10-CM

## 2014-11-11 DIAGNOSIS — N390 Urinary tract infection, site not specified: Secondary | ICD-10-CM | POA: Diagnosis not present

## 2014-11-11 DIAGNOSIS — R319 Hematuria, unspecified: Secondary | ICD-10-CM | POA: Diagnosis not present

## 2014-11-11 DIAGNOSIS — R358 Other polyuria: Secondary | ICD-10-CM

## 2014-11-11 LAB — POCT URINALYSIS DIPSTICK
BILIRUBIN UA: NEGATIVE
Glucose, UA: NEGATIVE
Ketones, UA: NEGATIVE
Leukocytes, UA: NEGATIVE
Nitrite, UA: NEGATIVE
Protein, UA: NEGATIVE
SPEC GRAV UA: 1.015
UROBILINOGEN UA: 0.2
pH, UA: 7

## 2014-11-11 MED ORDER — ALPRAZOLAM 0.25 MG PO TABS: ORAL_TABLET | ORAL | Status: DC

## 2014-11-11 MED ORDER — AMLODIPINE BESYLATE 5 MG PO TABS: 5.0000 mg | ORAL_TABLET | Freq: Every day | ORAL | Status: DC

## 2014-11-11 MED ORDER — NITROFURANTOIN MACROCRYSTAL 100 MG PO CAPS: 100.0000 mg | ORAL_CAPSULE | Freq: Two times a day (BID) | ORAL | Status: DC

## 2014-11-11 MED ORDER — PRAVASTATIN SODIUM 20 MG PO TABS: 20.0000 mg | ORAL_TABLET | Freq: Every day | ORAL | Status: DC

## 2014-11-11 MED ORDER — TRAZODONE HCL 100 MG PO TABS: 100.0000 mg | ORAL_TABLET | Freq: Every evening | ORAL | Status: DC | PRN

## 2014-11-11 MED ORDER — DONEPEZIL HCL 5 MG PO TABS: 5.0000 mg | ORAL_TABLET | Freq: Every day | ORAL | Status: DC

## 2014-11-11 NOTE — Progress Notes (Signed)
Patient: [name] Female    DOB: [birth year]   81 y.o.   MRN: [id number] Visit Date: 11/13/2014  Today's Provider: Lelon Huh, MD   Chief Complaint  Patient presents with  . Dementia    follow up   Subjective:    HPI   Dementia Follow up:  Last office visit was 03/05/2014 and no changes were made. At that time pt was stable on Aricept but off Namenda due to cost. Today patient comes in with her daughter Santiago Glad. Santiago Glad reports that the Dementia has worsened. Patient has not been taking any of her medications in over 6 months. Patient states she would forget to take them. Patients grandson has now moved in and has been seeing to it that pt take her medications regularly. Patient is unable to take a shower alone or cook. Patient has been roaming around at night and has been unable to sleep.  Patient is unable to stand for long periods of time (no more than 5 minutes) without getting off balance. Patient has also been more agitated. Patient has also been urinating more frequently and when she goes it usually a dribble or two.  Patient's daughter is here today stating that her son (the patient's grandson) has moved in with patient to be her full time caregiver. She has been out of all of her medications for a few months, and needs refills, so that the grandson can ensure she will start getting all of her medications. She was tolerating her medications well without problems before running out. When last seen in August they anticipated trying to find a nursing facility, but they state the grandson is providing very good care and she just needs to get her medications refilled. She has had no falls or injuries, and eats well when her meals are prepared for her.    Previous Medications   ASPIRIN 81 MG TABLET    Take 1 tablet by mouth daily.   FLUTICASONE (FLONASE) 50 MCG/ACT NASAL SPRAY    Place 2 sprays into both nostrils daily.   NEOMYCIN-POLYMYXIN-HYDROCORTISONE (CORTISPORIN) 3.5-10000-1 OTIC  SUSPENSION    Place 4 drops in ear(s) every 6 (six) hours as needed.    Review of Systems  Constitutional: Negative for fever, chills and fatigue.  HENT: Negative for congestion.   Respiratory: Negative for cough, chest tightness, shortness of breath and wheezing.   Cardiovascular: Negative for chest pain, palpitations and leg swelling.  Neurological: Negative for dizziness, tremors, seizures, syncope, speech difficulty, weakness, light-headedness, numbness and headaches.  Psychiatric/Behavioral: Positive for confusion and agitation.       Insomnia    History  Substance Use Topics  . Smoking status: Former Smoker -- 0.50 packs/day for 10 years    Types: Cigarettes    Start date: 07/25/1950    Quit date: 05/29/2012  . Smokeless tobacco: Not on file  . Alcohol Use: No   Objective:   BP 184/90 mmHg  Pulse 76  Temp(Src) 98.1 F (36.7 C) (Oral)  Resp 16  Wt 171 lb (77.565 kg)  SpO2 95%  Physical Exam   General Appearance:    Alert, cooperative, no distress  Eyes:    PERRL, conjunctiva/corneas clear, EOM's intact       Lungs:     Clear to auscultation bilaterally, respirations unlabored  Heart:    Regular rate and rhythm  Neurologic:   Awake, alert, oriented only to person. No apparent focal neurological  defect. She does answer questions appropriately.       Results for orders placed or performed in visit on 11/11/14  POCT Urinalysis Dipstick  Result Value Ref Range   Color, UA yellow    Clarity, UA clear    Glucose, UA neg    Bilirubin, UA neg    Ketones, UA neg    Spec Grav, UA 1.015    Blood, UA Trace- Non hemolyzed    pH, UA 7.0    Protein, UA neg    Urobilinogen, UA 0.2    Nitrite, UA neg    Leukocytes, UA Negative Negative        Assessment & Plan:     1. Polyuria Cover for UTI while awaiting culture results.  - POCT Urinalysis Dipstick - nitrofurantoin (MACRODANTIN) 100 MG capsule; Take 1 capsule (100 mg total) by mouth 2 (two) times daily.   Dispense: 14 capsule; Refill: 0  2. INSOMNIA, CHRONIC Try trazodone - traZODone (DESYREL) 100 MG tablet; Take 1 tablet (100 mg total) by mouth at bedtime as needed for sleep.  Dispense: 30 tablet; Refill: 3  3. Dementia, without behavioral disturbance Start back on donepezil - donepezil (ARICEPT) 5 MG tablet; Take 1 tablet (5 mg total) by mouth daily.  Dispense: 30 tablet; Refill: 5  4. Hyperlipidemia Had been tolerating pravastatin well. - pravastatin (PRAVACHOL) 20 MG tablet; Take 1 tablet (20 mg total) by mouth at bedtime.  Dispense: 30 tablet; Refill: 5  5. Essential hypertension BP way up today since she is off of medications. Counseled on importance of not letting her medications run out.  - amLODipine (NORVASC) 5 MG tablet; Take 1 tablet (5 mg total) by mouth daily.  Dispense: 30 tablet; Refill: 5   6. Urinary tract infection with hematuria, site unspecified - Urine Culture   7. Cerebral vascular disease  - aspirin 81 MG tablet; Take 1 tablet by mouth daily.  8. Acute anxiety Family states she gets very anxious in the afternoon, and becomes very difficult to keep calm. Will try very low dose Bz.  - ALPRAZolam (XANAX) 0.25 MG tablet; 1 tablet as needed, no more than twice a day for anxiety  Dispense: 30 tablet; Refill: 1  9. Other allergic rhinitis Get back on flonase which had been working well.  - fluticasone (FLONASE) 50 MCG/ACT nasal spray; Place 2 sprays into both nostrils daily. - neomycin-polymyxin-hydrocortisone (CORTISPORIN) 3.5-10000-1 otic suspension; Place 4 drops in ear(s) every 6 (six) hours as needed.   Follow up: Return in about 6 weeks (around 12/23/2014).

## 2014-11-13 ENCOUNTER — Encounter: Payer: Self-pay | Admitting: Family Medicine

## 2014-11-13 DIAGNOSIS — I679 Cerebrovascular disease, unspecified: Secondary | ICD-10-CM | POA: Insufficient documentation

## 2014-11-13 DIAGNOSIS — J309 Allergic rhinitis, unspecified: Secondary | ICD-10-CM | POA: Insufficient documentation

## 2014-11-13 LAB — URINE CULTURE

## 2014-12-19 ENCOUNTER — Encounter: Payer: Self-pay | Admitting: Family Medicine

## 2014-12-24 ENCOUNTER — Ambulatory Visit (INDEPENDENT_AMBULATORY_CARE_PROVIDER_SITE_OTHER): Payer: Medicare Other | Admitting: Family Medicine

## 2014-12-24 ENCOUNTER — Encounter: Payer: Self-pay | Admitting: Family Medicine

## 2014-12-24 VITALS — BP 140/80 | HR 79 | Temp 98.3°F | Resp 16 | Wt 162.0 lb

## 2014-12-24 DIAGNOSIS — I10 Essential (primary) hypertension: Secondary | ICD-10-CM

## 2014-12-24 DIAGNOSIS — F0390 Unspecified dementia without behavioral disturbance: Secondary | ICD-10-CM | POA: Diagnosis not present

## 2014-12-24 DIAGNOSIS — F419 Anxiety disorder, unspecified: Secondary | ICD-10-CM | POA: Insufficient documentation

## 2014-12-24 MED ORDER — DONEPEZIL HCL 10 MG PO TABS: 10.0000 mg | ORAL_TABLET | Freq: Every day | ORAL | Status: DC

## 2014-12-24 MED ORDER — DONEPEZIL HCL 10 MG PO TABS: 5.0000 mg | ORAL_TABLET | Freq: Every day | ORAL | Status: DC

## 2014-12-24 MED ORDER — ALPRAZOLAM 0.25 MG PO TABS: 0.2500 mg | ORAL_TABLET | Freq: Two times a day (BID) | ORAL | Status: DC

## 2014-12-24 NOTE — Progress Notes (Signed)
Patient: [name] Female    DOB: [birth year]   81 y.o.   MRN: [id number] Visit Date: 12/24/2014  Today's Provider: Lelon Huh, MD   Chief Complaint  Patient presents with  . Follow-up    6 weeks  . Dementia  . Insomnia  . Hypertension  . Anxiety   Subjective:    HPI  Follow-up for dementia from 11/11/2014, restarted Aricept 5 mg qd. Follow-up for insomnia from 11/11/2014, started trazodone 100 mg to help with sleep. Follow-up for anxiety from 11/11/2014, started xanax 0.25 mg.     Hypertension, follow-up:  BP Readings from Last 3 Encounters:  12/24/14 140/80  11/11/14 184/90  03/06/08 146/80    She was last seen for hypertension 1 months ago.  BP at that visit was 184/90. Management changes since that visit include Restarted amlodipine 5 mg qd.. She reports good compliance with treatment. She is not having side effects. none  She is not exercising. She is not adherent to low salt diet.   Outside blood pressures are N/A. She is experiencing none.  Patient denies none.   Cardiovascular risk factors include none.  Use of agents associated with hypertension: none.     Weight trend: stable Wt Readings from Last 3 Encounters:  12/24/14 162 lb (73.483 kg)  11/11/14 171 lb (77.565 kg)  03/06/08 176 lb 4 oz (79.946 kg)    Current diet: well balanced  ------------------------------------------------------------------------   Anxiety/Dementia Patient is now being cared for by her grandson and meals n wheels is coming most days. She was started on alprazolam at last visit, which they stopped helped tremendously when she was taking twice a day, but ran out last week. However her memory has not improved, and the family feels it may be a little worse since last visit when she was started on Aricept.   Allergies  Allergen Reactions  . Atorvastatin   . Sulfa Antibiotics   . Sulfadiazine    Previous Medications   ALPRAZOLAM (XANAX) 0.25 MG TABLET    1  tablet as needed, no more than twice a day for anxiety   AMLODIPINE (NORVASC) 5 MG TABLET    Take 1 tablet (5 mg total) by mouth daily.   ASPIRIN 81 MG TABLET    Take 1 tablet by mouth daily.   DONEPEZIL (ARICEPT) 5 MG TABLET    Take 1 tablet (5 mg total) by mouth daily.   FLUTICASONE (FLONASE) 50 MCG/ACT NASAL SPRAY    Place 2 sprays into both nostrils daily.   NEOMYCIN-POLYMYXIN-HYDROCORTISONE (CORTISPORIN) 3.5-10000-1 OTIC SUSPENSION    Place 4 drops in ear(s) every 6 (six) hours as needed.   NITROFURANTOIN (MACRODANTIN) 100 MG CAPSULE    Take 1 capsule (100 mg total) by mouth 2 (two) times daily.   PRAVASTATIN (PRAVACHOL) 20 MG TABLET    Take 1 tablet (20 mg total) by mouth at bedtime.   TRAZODONE (DESYREL) 100 MG TABLET    Take 1 tablet (100 mg total) by mouth at bedtime as needed for sleep.    Review of Systems  Cardiovascular: Negative.   Musculoskeletal: Positive for myalgias.  Neurological: Negative for dizziness, light-headedness and headaches.    History  Substance Use Topics  . Smoking status: Former Smoker -- 0.50 packs/day for 10 years    Types: Cigarettes    Start date: 07/25/1950    Quit date: 05/29/2012  . Smokeless tobacco: Not on file  . Alcohol Use: No   Objective:  BP 140/80 mmHg  Pulse 79  Temp(Src) 98.3 F (36.8 C) (Oral)  Resp 16  Wt 162 lb (73.483 kg)  SpO2 93%  Physical Exam  General Appearance:    Alert, cooperative, no distress  Eyes:    PERRL, conjunctiva/corneas clear, EOM's intact       Lungs:     Clear to auscultation bilaterally, respirations unlabored  Heart:    Regular rate and rhythm  Neurologic:   Awake, alert, not oriented to time or place. . No apparent focal neurological  defect.            Assessment & Plan:     1. Dementia, without behavioral disturbance Discussed with family that alprazolam may be having a detrimental affect on memory, but her anxiety is greatly improved and she is much more functional since starting  alprazolam. We may see a little improvement on higher dose of aricept so will increase to 10mg  daily  2. Acute anxiety Greatly improved on alprazolem - ALPRAZolam (XANAX) 0.25 MG tablet; Take 1 tablet (0.25 mg total) by mouth 2 (two) times daily.  Dispense: 60 tablet; Refill: 5  3. Essential hypertension Much better now that her family is ensuring that she it taking her medication consistently.      Return in about 3 months (around 03/26/2015).  Lelon Huh, MD  [facility]

## 2015-02-05 ENCOUNTER — Telehealth: Payer: Self-pay | Admitting: Family Medicine

## 2015-02-05 NOTE — Telephone Encounter (Signed)
Tim was notified that this would be fine.

## 2015-02-05 NOTE — Telephone Encounter (Signed)
Pt is coming in Thursday 9/15 at 11:00 am for fl2 update and tb test.  They are putting her in assisted living.   Her text will need to be read Saturday am.  Just wanting to know if it is ok for her to come sat. Am during the clinic just to read the test.    Thanks teri

## 2015-02-11 ENCOUNTER — Ambulatory Visit (INDEPENDENT_AMBULATORY_CARE_PROVIDER_SITE_OTHER): Payer: Medicare Other | Admitting: Family Medicine

## 2015-02-11 ENCOUNTER — Encounter: Payer: Self-pay | Admitting: Family Medicine

## 2015-02-11 VITALS — BP 142/72 | HR 74 | Temp 97.8°F | Resp 16 | Ht 65.0 in | Wt 153.0 lb

## 2015-02-11 DIAGNOSIS — Z111 Encounter for screening for respiratory tuberculosis: Secondary | ICD-10-CM | POA: Diagnosis not present

## 2015-02-11 DIAGNOSIS — I679 Cerebrovascular disease, unspecified: Secondary | ICD-10-CM

## 2015-02-11 DIAGNOSIS — I10 Essential (primary) hypertension: Secondary | ICD-10-CM

## 2015-02-11 DIAGNOSIS — Z23 Encounter for immunization: Secondary | ICD-10-CM | POA: Diagnosis not present

## 2015-02-11 DIAGNOSIS — F0390 Unspecified dementia without behavioral disturbance: Secondary | ICD-10-CM

## 2015-02-11 DIAGNOSIS — E785 Hyperlipidemia, unspecified: Secondary | ICD-10-CM

## 2015-02-11 NOTE — Progress Notes (Signed)
Patient: [name] Female    DOB: 04-07-1934   82 y.o.   MRN: [id number] Visit Date: 02/11/2015  Today's Provider: Lelon Huh, MD   Chief Complaint  Patient presents with  . Hypertension    follow up  . Anxiety    follow up  . Dementia    follow up   Subjective:    HPI  Hypertension, follow-up:  BP Readings from Last 3 Encounters:  02/11/15 142/72  12/24/14 140/80  11/11/14 184/90    She was last seen for hypertension 3 months ago.  BP at that visit was  140/80 Management since that visit includes  none She reports good compliance with treatment. She is not having side effects.  She is not exercising. She is adherent to low salt diet.   Outside blood pressures are not being checked She is experiencing none.  Patient denies chest pain, chest pressure/discomfort, claudication, dyspnea, exertional chest pressure/discomfort, fatigue, irregular heart beat, lower extremity edema, near-syncope, orthopnea, palpitations, paroxysmal nocturnal dyspnea, syncope and tachypnea.   Cardiovascular risk factors include advanced age (older than 6 for men, 56 for women), hypertension and sedentary lifestyle.  Use of agents associated with hypertension: NSAIDS.     Weight trend: stable Wt Readings from Last 3 Encounters:  02/11/15 153 lb (69.4 kg)  12/24/14 162 lb (73.483 kg)  11/11/14 171 lb (77.565 kg)    Current diet: in general, a "healthy" diet     ------------------------------------------------------------------------   Anxiety Follow up: Last office visit was 3 months ago and no changes were made. Patient son reports good compliance with treatment, good tolerance and fair symptom control. Family reports she has been much calmer since being on scheduled doses of alprazolam and would like to continue current dose.     Dementia Follow up: Last office visit was 3 months ago. Changes made during this visit include increasing Aricept to 10mg  daily. Patient  son states he has not seen any improvement and things are unchanged since last office visit. Patient son states that patient will be moving into an assisted living facility and she needed her FL2 forms updated.   Allergies  Allergen Reactions  . Atorvastatin   . Sulfa Antibiotics   . Sulfadiazine    Previous Medications   ALPRAZOLAM (XANAX) 0.25 MG TABLET    Take 1 tablet (0.25 mg total) by mouth 2 (two) times daily.   AMLODIPINE (NORVASC) 5 MG TABLET    Take 1 tablet (5 mg total) by mouth daily.   ASPIRIN 81 MG TABLET    Take 1 tablet by mouth daily.   DONEPEZIL (ARICEPT) 10 MG TABLET    Take 1 tablet (10 mg total) by mouth daily.   PRAVASTATIN (PRAVACHOL) 20 MG TABLET    Take 1 tablet (20 mg total) by mouth at bedtime.   TRAZODONE (DESYREL) 100 MG TABLET    Take 1 tablet (100 mg total) by mouth at bedtime as needed for sleep.    Review of Systems  Constitutional: Negative for fever, chills, appetite change and fatigue.  Respiratory: Negative for chest tightness and shortness of breath.   Cardiovascular: Negative for chest pain and palpitations.  Gastrointestinal: Negative for nausea, vomiting and abdominal pain.  Neurological: Negative for dizziness and weakness.    Social History  Substance Use Topics  . Smoking status: Former Smoker -- 0.50 packs/day for 10 years    Types: Cigarettes    Start date: 07/25/1950    Quit date:  05/29/2012  . Smokeless tobacco: Not on file  . Alcohol Use: No   Objective:   BP 142/72 mmHg  Pulse 74  Temp(Src) 97.8 F (36.6 C) (Oral)  Resp 16  Ht 5\' 5"  (1.651 m)  Wt 153 lb (69.4 kg)  BMI 25.46 kg/m2  SpO2 95%  Physical Exam   General Appearance:    Alert, cooperative, no distress  Eyes:    PERRL, conjunctiva/corneas clear, EOM's intact       Lungs:     Clear to auscultation bilaterally, respirations unlabored  Heart:    Regular rate and rhythm  Neurologic:   Awake, alert, oriented x 1. No apparent focal neurological           defect.           Assessment & Plan:     1. Hyperlipidemia She is tolerating pravastatin well with no adverse effects.   - Lipid panel  2. Dementia, without behavioral disturbance Stable on current dose of donepezil. Behavior much better since starting alprazolam   3. Essential hypertension well controlled Continue current medications.   - Renal function panel  4. Cerebral vascular disease   5. Screening-pulmonary TB  - Quantiferon tb gold assay  6. Need for pneumococcal vaccination  - Pneumococcal polysaccharide vaccine 23-valent greater than or equal to 2yo subcutaneous/IM  7. Flu vaccine need  - Flu vaccine HIGH DOSE PF  Reviewed and completed FMLA forms for assisted living facility.       Lelon Huh, MD  [facility]

## 2015-02-15 LAB — QUANTIFERON IN TUBE
QFT TB AG MINUS NIL VALUE: 0.01 [IU]/mL
QUANTIFERON MITOGEN VALUE: 4.41 [IU]/mL
QUANTIFERON TB AG VALUE: 0.06 [IU]/mL
QUANTIFERON TB GOLD: NEGATIVE
Quantiferon Nil Value: 0.05 IU/mL

## 2015-02-15 LAB — RENAL FUNCTION PANEL
ALBUMIN: 4.3 g/dL (ref 3.5–4.8)
BUN / CREAT RATIO: 10 — AB (ref 11–26)
BUN: 7 mg/dL — ABNORMAL LOW (ref 8–27)
CO2: 27 mmol/L (ref 18–29)
CREATININE: 0.69 mg/dL (ref 0.57–1.00)
Calcium: 9.2 mg/dL (ref 8.7–10.3)
Chloride: 100 mmol/L (ref 97–108)
GFR, EST AFRICAN AMERICAN: 96 mL/min/{1.73_m2} (ref >59)
GFR, EST NON AFRICAN AMERICAN: 83 mL/min/{1.73_m2} (ref >59)
Glucose: 109 mg/dL — ABNORMAL HIGH (ref 65–99)
Phosphorus: 2.2 mg/dL — ABNORMAL LOW (ref 2.5–4.5)
Potassium: 3.4 mmol/L — ABNORMAL LOW (ref 3.5–5.2)
Sodium: 141 mmol/L (ref 134–144)

## 2015-02-15 LAB — LIPID PANEL
Chol/HDL Ratio: 3.3 ratio units (ref 0.0–4.4)
Cholesterol, Total: 173 mg/dL (ref 100–199)
HDL: 53 mg/dL (ref >39)
LDL CALC: 100 mg/dL — AB (ref 0–99)
Triglycerides: 102 mg/dL (ref 0–149)
VLDL CHOLESTEROL CAL: 20 mg/dL (ref 5–40)

## 2015-02-15 LAB — QUANTIFERON TB GOLD ASSAY (BLOOD)

## 2015-02-19 ENCOUNTER — Telehealth: Payer: Self-pay | Admitting: Family Medicine

## 2015-02-19 DIAGNOSIS — G4700 Insomnia, unspecified: Secondary | ICD-10-CM

## 2015-02-19 MED ORDER — TRAZODONE HCL 100 MG PO TABS
100.0000 mg | ORAL_TABLET | Freq: Every evening | ORAL | Status: DC | PRN
Start: 2015-02-19 — End: 2015-02-25

## 2015-02-19 MED ORDER — TRAZODONE HCL 100 MG PO TABS: 100.0000 mg | ORAL_TABLET | Freq: Every evening | ORAL | Status: AC | PRN

## 2015-02-19 NOTE — Telephone Encounter (Signed)
prescription for printed, please fax as requested. Thanks.

## 2015-02-19 NOTE — Telephone Encounter (Signed)
Is it okay to print and fax trazodone 100 mg rx?

## 2015-02-19 NOTE — Telephone Encounter (Signed)
[name] with [facility] called stating he will need written verification for traZODone (DESYREL) 100 MG.  Fax#[phone] and CB#[phone]/MW

## 2015-02-19 NOTE — Telephone Encounter (Signed)
Rx faxed to pharmacy  

## 2015-02-22 ENCOUNTER — Telehealth: Payer: Self-pay | Admitting: Family Medicine

## 2015-02-25 ENCOUNTER — Other Ambulatory Visit: Payer: Self-pay | Admitting: Family Medicine

## 2015-03-01 ENCOUNTER — Other Ambulatory Visit: Payer: Self-pay | Admitting: Family Medicine

## 2015-03-01 DIAGNOSIS — E785 Hyperlipidemia, unspecified: Secondary | ICD-10-CM

## 2015-03-01 MED ORDER — PRAVASTATIN SODIUM 20 MG PO TABS: 20.0000 mg | ORAL_TABLET | Freq: Every day | ORAL | Status: DC

## 2015-03-13 ENCOUNTER — Other Ambulatory Visit: Payer: Self-pay

## 2015-03-13 DIAGNOSIS — E785 Hyperlipidemia, unspecified: Secondary | ICD-10-CM

## 2015-03-13 NOTE — Telephone Encounter (Signed)
Pharmacy requesting 90 day supply

## 2015-03-14 MED ORDER — PRAVASTATIN SODIUM 20 MG PO TABS: 20.0000 mg | ORAL_TABLET | Freq: Every day | ORAL | Status: DC

## 2015-03-25 ENCOUNTER — Ambulatory Visit: Payer: Medicare Other | Admitting: Family Medicine

## 2015-04-28 IMAGING — CT CT HEAD WITHOUT CONTRAST
1 series · 16 of 28 positions shown, 20 images · non-contrast
Comparison: none

REASON FOR EXAM: memory loss HA
COMMENTS:

[Series 2: soft tissue · axial · 0.40mm/px · z∈[-74,+51]mm · 16 of 28 slices shown, 20 images]
[im 2/28  brain]
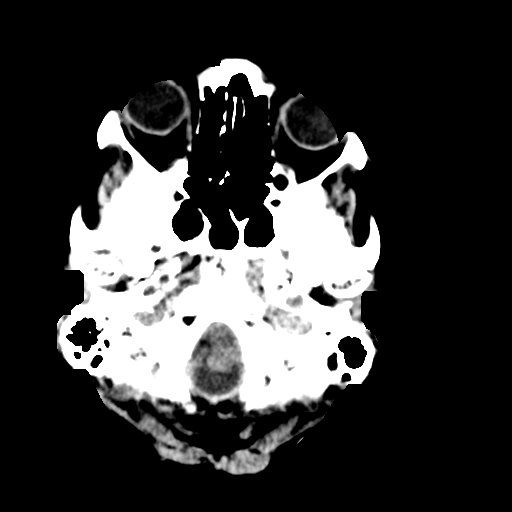
[im 2/28  bone]
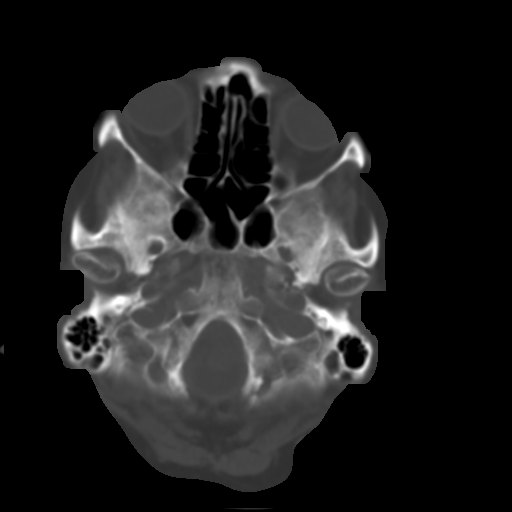
[im 4/28  brain]
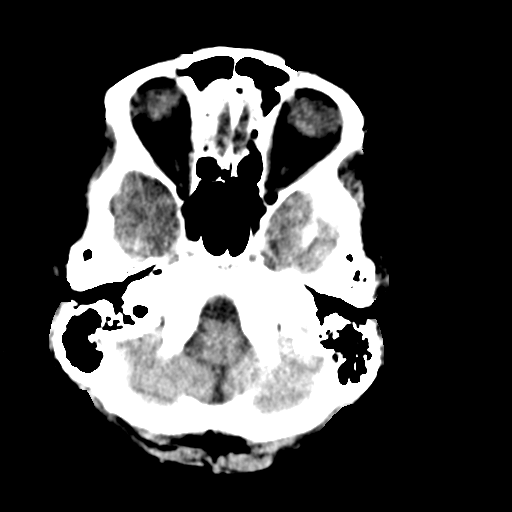
[im 6/28  brain]
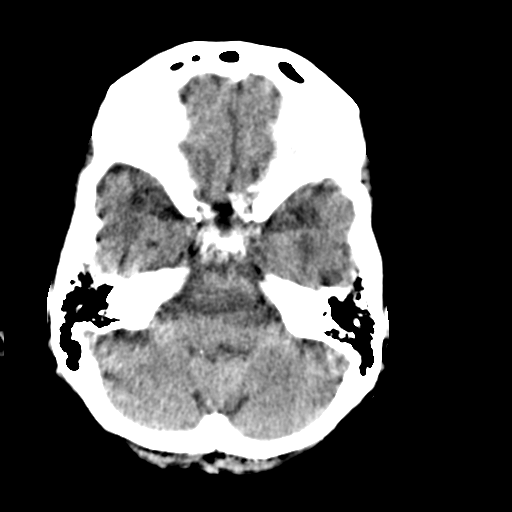
[im 7/28  brain]
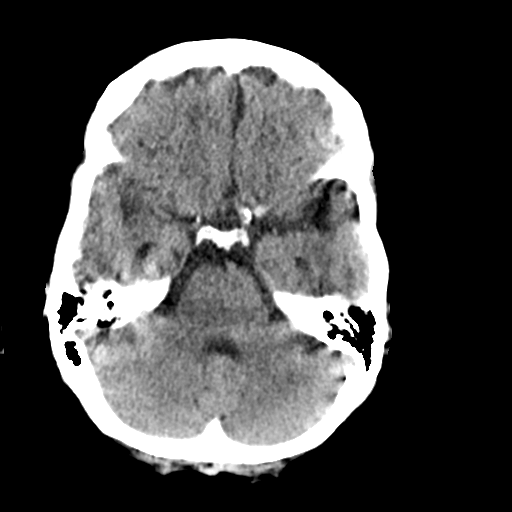
[im 9/28  brain]
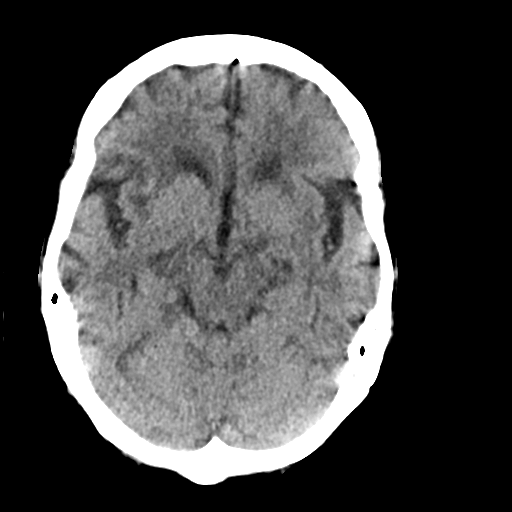
[im 9/28  bone]
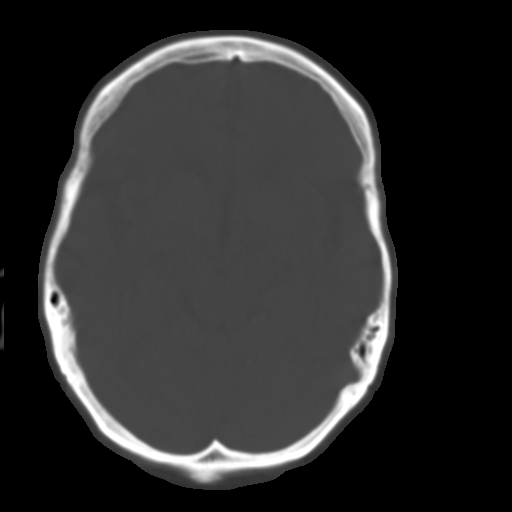
[im 10/28  brain]
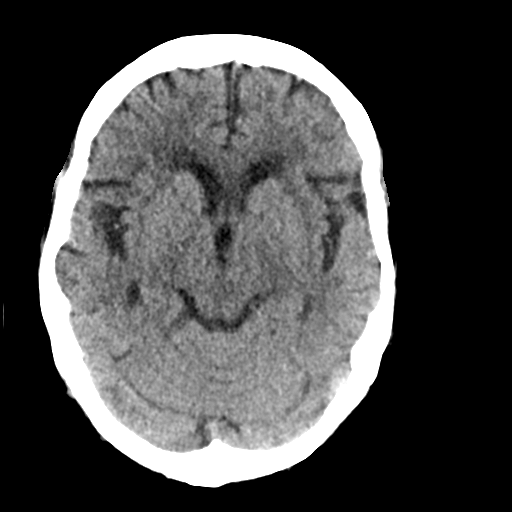
[im 12/28  brain]
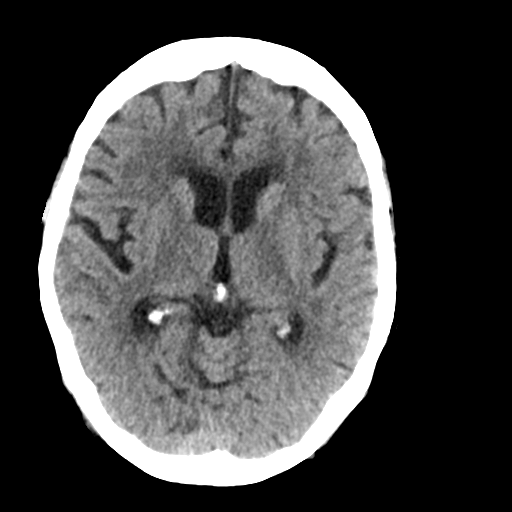
[im 14/28  brain]
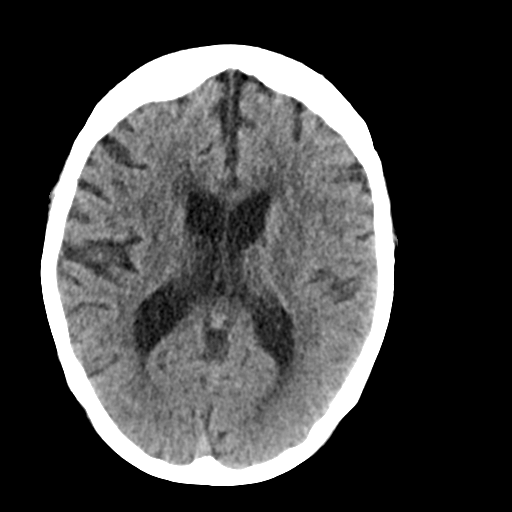
[im 15/28  brain]
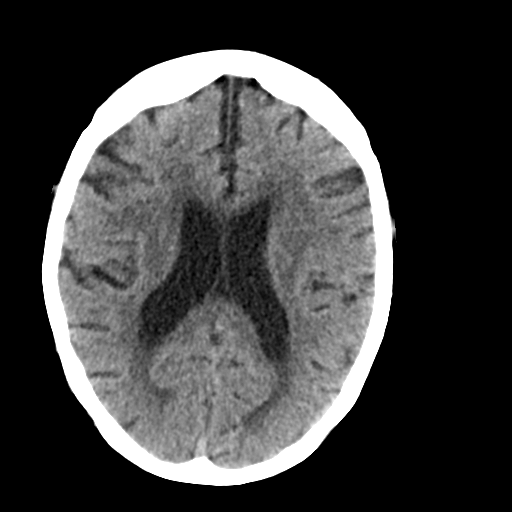
[im 15/28  bone]
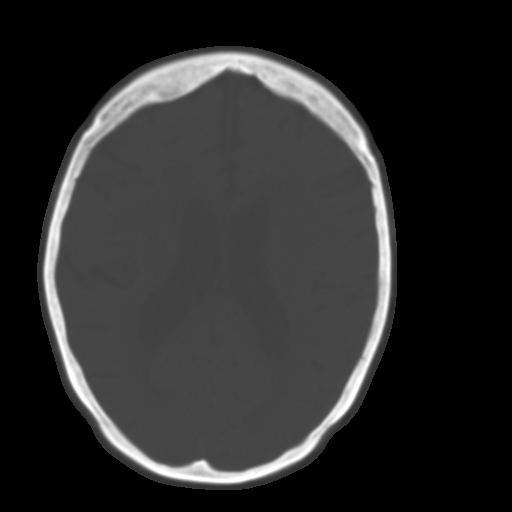
[im 17/28  brain]
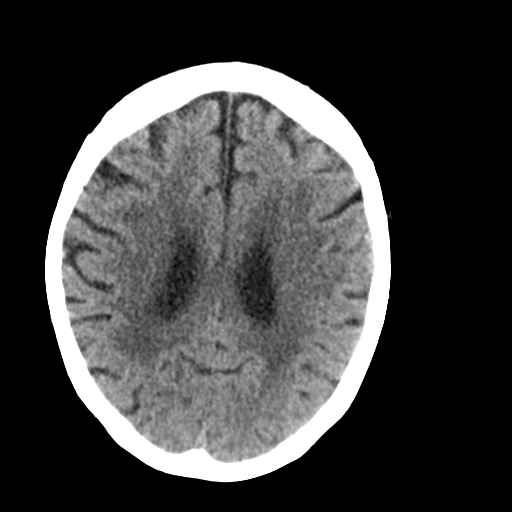
[im 19/28  brain]
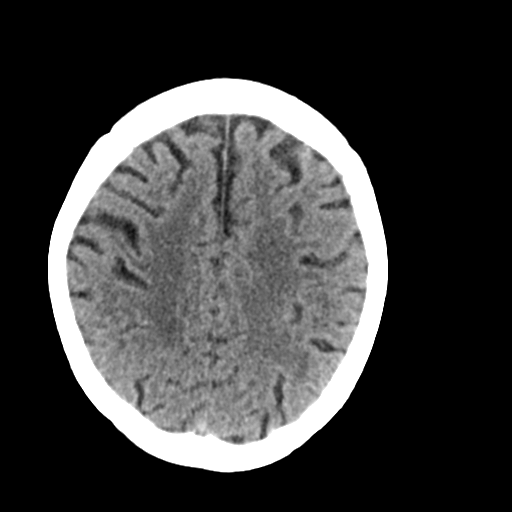
[im 20/28  brain]
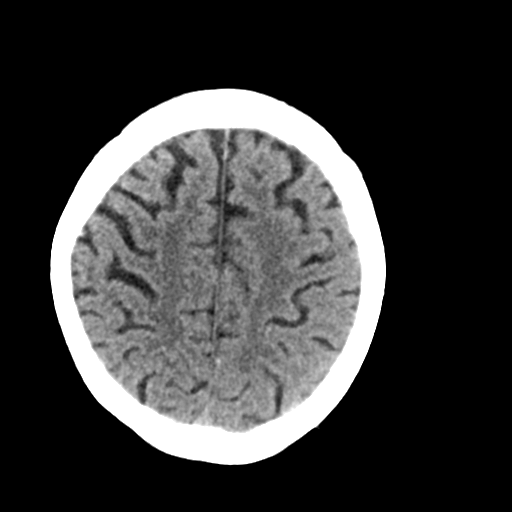
[im 22/28  brain]
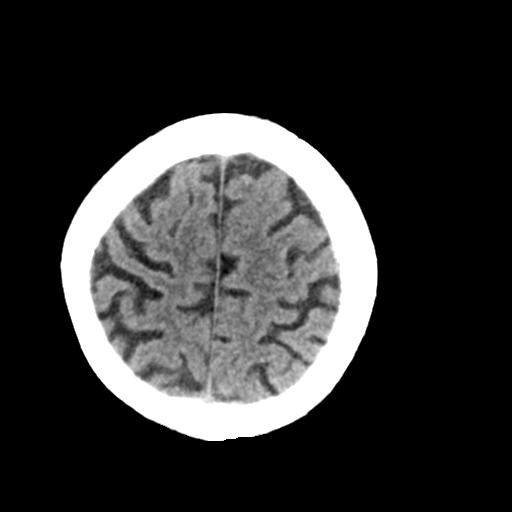
[im 22/28  bone]
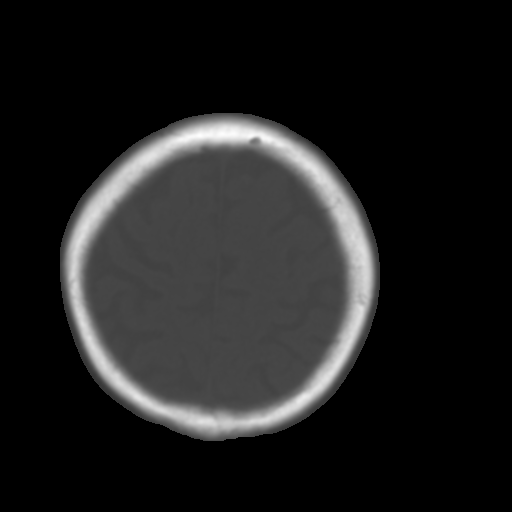
[im 23/28  brain]
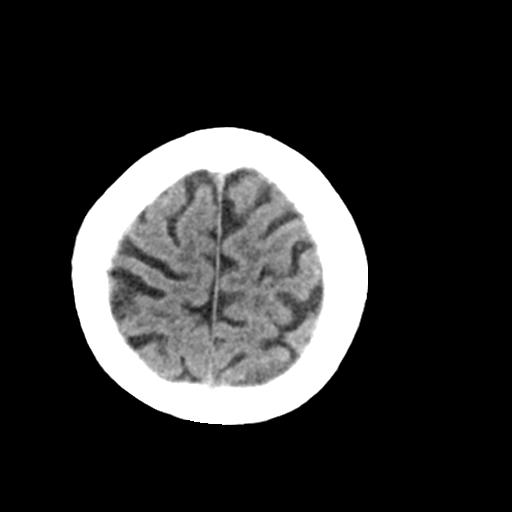
[im 25/28  brain]
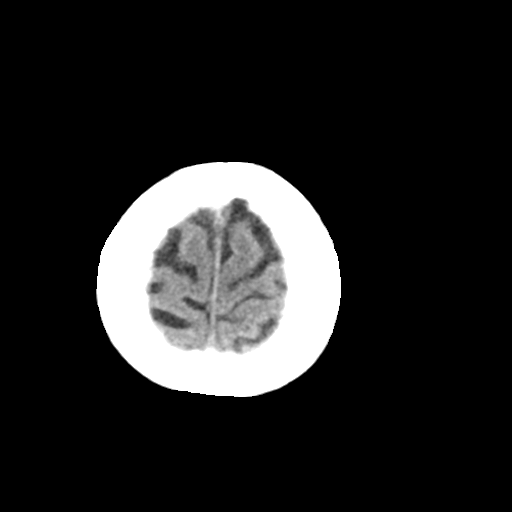
[im 27/28  brain]
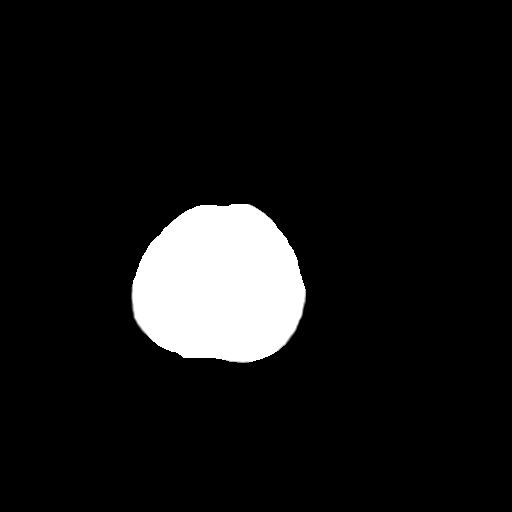

[16 of 28 positions shown; findings below may reference images not displayed]

PROCEDURE:     KCT - KCT HEAD WITHOUT CONTRAST  - March 05, 2013  [DATE]

RESULT:     Noncontrast CT of the brain demonstrates low-attenuation
diffusely in the periventricular and subcortical white matter with mild
diffuse prominence of the ventricles and sulci consistent with atrophy.
There is no evidence of intracranial hemorrhage, mass, mass effect or
evolving infarct. The sinuses and mastoid air cells show normal appearing
aeration. The calvarium appears intact.
IMPRESSION: 1. Atrophy with chronic microvascular ischemic change. No evidence of acute
intracranial abnormality.

[REDACTED]

## 2015-07-16 ENCOUNTER — Other Ambulatory Visit: Payer: Self-pay | Admitting: Family Medicine

## 2015-08-16 ENCOUNTER — Other Ambulatory Visit: Payer: Self-pay | Admitting: Family Medicine

## 2015-08-16 NOTE — Telephone Encounter (Signed)
Please call in alprazolam.  

## 2015-08-18 NOTE — Telephone Encounter (Signed)
[facility] called about her generic xanax 0.25mg .  E script came in denied.  Can we check into this.  Their fax number is [phone]  Jackelyn Poling is the pharmacy tech.  Her number is [phone]  Thanks, Con Memos

## 2015-08-19 NOTE — Telephone Encounter (Signed)
Called in alprazolam into pharmacy.

## 2015-09-02 ENCOUNTER — Encounter: Payer: Self-pay | Admitting: Family Medicine

## 2015-09-02 ENCOUNTER — Ambulatory Visit (INDEPENDENT_AMBULATORY_CARE_PROVIDER_SITE_OTHER): Payer: Medicare Other | Admitting: Family Medicine

## 2015-09-02 VITALS — BP 138/70 | HR 79 | Temp 98.6°F | Resp 18 | Wt 146.0 lb

## 2015-09-02 DIAGNOSIS — F0390 Unspecified dementia without behavioral disturbance: Secondary | ICD-10-CM

## 2015-09-02 DIAGNOSIS — I10 Essential (primary) hypertension: Secondary | ICD-10-CM | POA: Diagnosis not present

## 2015-09-02 NOTE — Progress Notes (Signed)
Patient: [name] Female    DOB: 1934/03/19   82 y.o.   MRN: [id number] Visit Date: 09/02/2015  Today's Provider: Lelon Huh, MD   Chief Complaint  Patient presents with  . Hypertension    follow up  . Hyperlipidemia    follow up  . Dementia    follow up   Subjective:    HPI  Hypertension, follow-up:  BP Readings from Last 3 Encounters:  02/11/15 142/72  12/24/14 140/80  11/11/14 184/90    She was last seen for hypertension 6 months ago.  BP at that visit was 142/72. Management since that visit includes no changes. She reports good compliance with treatment. She is not having side effects.  She is exercising. She is adherent to low salt diet.   Outside blood pressures are checked at patient assisted living home ([facility]). She is experiencing none.  Patient denies chest pain, chest pressure/discomfort, claudication, dyspnea, exertional chest pressure/discomfort, fatigue, irregular heart beat, lower extremity edema, near-syncope, orthopnea, palpitations, paroxysmal nocturnal dyspnea, syncope and tachypnea.   Cardiovascular risk factors include dyslipidemia and hypertension.  Use of agents associated with hypertension: NSAIDS.     Weight trend: slightly decreased Wt Readings from Last 3 Encounters:  02/11/15 153 lb (69.4 kg)  12/24/14 162 lb (73.483 kg)  11/11/14 171 lb (77.565 kg)    Current diet: in general, a "healthy" diet    ------------------------------------------------------------------------   Lipid/Cholesterol, Follow-up:   Last seen for this6 months ago.  Management changes since that visit include none. . Last Lipid Panel:    Component Value Date/Time   CHOL 173 02/11/2015 1230   CHOL 232* 11/08/2007 1138   TRIG 102 02/11/2015 1230   HDL 53 02/11/2015 1230   HDL 42.2 11/08/2007 1138   CHOLHDL 3.3 02/11/2015 1230   CHOLHDL 5.5 CALC 11/08/2007 1138   VLDL 15 11/08/2007 1138   LDLCALC 100* 02/11/2015 1230   LDLDIRECT 175.5 11/08/2007 1138    Risk factors for vascular disease include hypercholesterolemia and hypertension  She reports good compliance with treatment. She is not having side effects.  Current symptoms include none and have been stable. Weight trend: decreasing steadily Prior visit with dietician: no Current diet: in general, a "healthy" diet   Current exercise: walking  Wt Readings from Last 3 Encounters:  02/11/15 153 lb (69.4 kg)  12/24/14 162 lb (73.483 kg)  11/11/14 171 lb (77.565 kg)    ------------------------------------------------------------------- Follow up Dementia: Last office visit was 6 months ago and no changes were made. Patients son comes in today stating her memory has slightly worsened. Is still taking donepezil and tolerating well.  She is now in assisted living doing well  By her sons' report. Has good appetite and interacting well with other residents. Memory continues to worsen, and usually forgets things after about 5 minutes.   Follow up Cerebral Vascular Disease: Last office visit was 6 months ago and no changes were made. Patient son reports good compliance with treatment.     Allergies  Allergen Reactions  . Atorvastatin   . Sulfa Antibiotics   . Sulfadiazine    Previous Medications   ALPRAZOLAM (XANAX) 0.25 MG TABLET    TAKE 1 TABLET BY MOUTH TWICE DAILY FOR ANXIETY   AMLODIPINE (NORVASC) 5 MG TABLET    TAKE 1 TABLET BY MOUTH ONCE DAILY FOR HEART/BP   ASPIR-LOW 81 MG EC TABLET    TAKE 1 TABLET BY MOUTH ONCE DAILY FOR ANTICOAGULATION  DONEPEZIL (ARICEPT) 10 MG TABLET    TAKE 1 TABLET BY MOUTH ONCE DAILY FOR DEMENTIA   PRAVASTATIN (PRAVACHOL) 20 MG TABLET    TAKE 1 TABLET BY MOUTH ONCE DAILY AT BEDTIME FOR CHOLESTEROL   TRAZODONE (DESYREL) 100 MG TABLET    Take 1 tablet (100 mg total) by mouth at bedtime as needed for sleep.    Review of Systems  Constitutional: Negative for fever, chills, appetite change and fatigue.  Respiratory:  Negative for chest tightness and shortness of breath.   Cardiovascular: Negative for chest pain and palpitations.  Gastrointestinal: Negative for nausea, vomiting and abdominal pain.  Neurological: Negative for dizziness and weakness.  Psychiatric/Behavioral: Positive for confusion.       Trouble with memory    Social History  Substance Use Topics  . Smoking status: Former Smoker -- 0.50 packs/day for 10 years    Types: Cigarettes    Start date: 07/25/1950    Quit date: 05/29/2012  . Smokeless tobacco: Not on file  . Alcohol Use: No   Objective:   BP 138/70 mmHg  Pulse 79  Temp(Src) 98.6 F (37 C) (Oral)  Resp 18  Wt 146 lb (66.225 kg)  SpO2 99%  Physical Exam   General Appearance:    Alert, cooperative, no distress  Eyes:    PERRL, conjunctiva/corneas clear, EOM's intact       Lungs:     Clear to auscultation bilaterally, respirations unlabored  Heart:    Regular rate and rhythm  Neurologic:   Awake, alert, oriented to person only. No apparent focal neurological           defect. Pleasant, interacts and answers questions appropriately.         Assessment & Plan:     1. Dementia, without behavioral disturbance Continues to decline. Discusses options of adding additional medication, discontinuing Aricept since she is already requiring assisted living, or continuing current therapy. Family members feel it is worthwhile to continue Aricept since it is not cost prohibitive and not having any adverse effects.   2. Essential hypertension Well controlled.  Continue current medications.         Lelon Huh, MD  [facility]

## 2015-11-15 ENCOUNTER — Other Ambulatory Visit: Payer: Self-pay | Admitting: Family Medicine

## 2015-11-15 NOTE — Telephone Encounter (Signed)
Please call in alprazolam.  

## 2015-11-16 NOTE — Telephone Encounter (Signed)
Rx called in to pharmacy. 

## 2015-11-17 ENCOUNTER — Telehealth: Payer: Self-pay | Admitting: Family Medicine

## 2015-11-17 NOTE — Telephone Encounter (Signed)
Please advise 

## 2015-11-17 NOTE — Telephone Encounter (Signed)
Daughter in law called wanting to know if the paper work that they dropped off last Wednesday has been filled out.  They are trying to get her into assisted lilving and they have an opening at [facility].  Please call her back at [phone]  Thanks, Con Memos

## 2015-11-17 NOTE — Telephone Encounter (Signed)
Karen was notified

## 2015-11-17 NOTE — Telephone Encounter (Signed)
It's ready

## 2015-11-26 DIAGNOSIS — I119 Hypertensive heart disease without heart failure: Secondary | ICD-10-CM | POA: Diagnosis not present

## 2016-01-04 NOTE — Telephone Encounter (Signed)
error 

## 2016-01-08 DIAGNOSIS — I119 Hypertensive heart disease without heart failure: Secondary | ICD-10-CM | POA: Diagnosis not present

## 2016-01-29 DIAGNOSIS — I119 Hypertensive heart disease without heart failure: Secondary | ICD-10-CM | POA: Diagnosis not present

## 2016-03-09 DIAGNOSIS — N189 Chronic kidney disease, unspecified: Secondary | ICD-10-CM | POA: Diagnosis not present

## 2016-03-09 DIAGNOSIS — E7800 Pure hypercholesterolemia, unspecified: Secondary | ICD-10-CM | POA: Diagnosis not present

## 2016-03-09 DIAGNOSIS — D649 Anemia, unspecified: Secondary | ICD-10-CM | POA: Diagnosis not present

## 2016-03-14 DIAGNOSIS — Z5181 Encounter for therapeutic drug level monitoring: Secondary | ICD-10-CM | POA: Diagnosis not present

## 2016-03-24 DIAGNOSIS — I119 Hypertensive heart disease without heart failure: Secondary | ICD-10-CM | POA: Diagnosis not present

## 2016-04-07 ENCOUNTER — Telehealth: Payer: Self-pay | Admitting: Family Medicine

## 2016-04-07 NOTE — Telephone Encounter (Signed)
Called Pt to schedule AWV with [facility] - knb °

## 2016-06-02 DIAGNOSIS — I119 Hypertensive heart disease without heart failure: Secondary | ICD-10-CM | POA: Diagnosis not present

## 2016-06-30 ENCOUNTER — Telehealth: Payer: Self-pay | Admitting: Family Medicine

## 2016-06-30 NOTE — Telephone Encounter (Signed)
Called Pt to schedule AWV with [facility] - knb °

## 2016-08-09 ENCOUNTER — Telehealth: Payer: Self-pay | Admitting: Family Medicine

## 2016-08-09 NOTE — Telephone Encounter (Signed)
Called Pt to schedule AWV with [facility] - knb °

## 2017-10-27 DEATH — deceased
